# Patient Record
Sex: Female | Born: 1975 | Race: White | Hispanic: No | Marital: Married | Smoking: Never smoker
Health system: Southern US, Community
[De-identification: ages and names within clinical notes are randomized; demographics above are authoritative.]

## PROBLEM LIST (undated history)

## (undated) DIAGNOSIS — R51 Headache: Secondary | ICD-10-CM

## (undated) DIAGNOSIS — G8929 Other chronic pain: Secondary | ICD-10-CM

## (undated) DIAGNOSIS — F329 Major depressive disorder, single episode, unspecified: Secondary | ICD-10-CM

## (undated) DIAGNOSIS — F32A Depression, unspecified: Secondary | ICD-10-CM

## (undated) DIAGNOSIS — F319 Bipolar disorder, unspecified: Secondary | ICD-10-CM

## (undated) DIAGNOSIS — N8003 Adenomyosis of the uterus: Secondary | ICD-10-CM

## (undated) HISTORY — DX: Adenomyosis of the uterus: N80.03

## (undated) HISTORY — PX: CARPAL TUNNEL RELEASE: SHX101

## (undated) HISTORY — DX: Bipolar disorder, unspecified: F31.9

## (undated) HISTORY — DX: Other chronic pain: G89.29

## (undated) HISTORY — DX: Depression, unspecified: F32.A

## (undated) HISTORY — DX: Headache: R51

## (undated) HISTORY — DX: Major depressive disorder, single episode, unspecified: F32.9

---

## 2015-03-24 DIAGNOSIS — F329 Major depressive disorder, single episode, unspecified: Secondary | ICD-10-CM | POA: Insufficient documentation

## 2015-03-24 DIAGNOSIS — F411 Generalized anxiety disorder: Secondary | ICD-10-CM | POA: Insufficient documentation

## 2015-03-24 DIAGNOSIS — F32A Depression, unspecified: Secondary | ICD-10-CM | POA: Insufficient documentation

## 2017-07-19 DIAGNOSIS — E119 Type 2 diabetes mellitus without complications: Secondary | ICD-10-CM | POA: Insufficient documentation

## 2017-10-19 DIAGNOSIS — G43809 Other migraine, not intractable, without status migrainosus: Secondary | ICD-10-CM | POA: Insufficient documentation

## 2017-10-19 DIAGNOSIS — G4752 REM sleep behavior disorder: Secondary | ICD-10-CM | POA: Insufficient documentation

## 2018-08-01 ENCOUNTER — Encounter: Payer: Self-pay | Admitting: Neurology

## 2018-08-22 ENCOUNTER — Ambulatory Visit (INDEPENDENT_AMBULATORY_CARE_PROVIDER_SITE_OTHER): Payer: 59 | Admitting: Neurology

## 2018-08-22 ENCOUNTER — Encounter: Payer: Self-pay | Admitting: Neurology

## 2018-08-22 VITALS — BP 130/82 | HR 94 | Ht 61.0 in | Wt 202.0 lb

## 2018-08-22 DIAGNOSIS — G43709 Chronic migraine without aura, not intractable, without status migrainosus: Secondary | ICD-10-CM

## 2018-08-22 DIAGNOSIS — G43109 Migraine with aura, not intractable, without status migrainosus: Secondary | ICD-10-CM | POA: Diagnosis not present

## 2018-08-22 DIAGNOSIS — G43809 Other migraine, not intractable, without status migrainosus: Secondary | ICD-10-CM

## 2018-08-22 MED ORDER — ERGOTAMINE-CAFFEINE 1-100 MG PO TABS: ORAL_TABLET | ORAL | 3 refills | Status: DC

## 2018-08-22 MED ORDER — ERENUMAB-AOOE 70 MG/ML ~~LOC~~ SOAJ: 70.0000 mg | SUBCUTANEOUS | 11 refills | Status: DC

## 2018-08-22 MED ORDER — ERENUMAB-AOOE 70 MG/ML ~~LOC~~ SOAJ: 70.0000 mg | Freq: Once | SUBCUTANEOUS | 0 refills | Status: AC

## 2018-08-22 NOTE — Addendum Note (Signed)
Addended by: Dorthy Cooler on: 08/22/2018 01:01 PM   Modules accepted: Orders

## 2018-08-22 NOTE — Progress Notes (Signed)
NEUROLOGY CONSULTATION NOTE  Dustine Bertini MRN: [id number] DOB: 05-Sep-1976  Referring provider: Christena Flake, MD  Primary care provider: Christena Flake, MD  Reason for consult:  migraine  HISTORY OF PRESENT ILLNESS: Vanessa Huff is a 42 year old female with depression, generalized anxiety disorder, type 2 diabetes, and vestibular migraine who presents for migraine.  History supplemented by referring providers note.  She is accompanied by her husband who supplements history.  Onset:  Since adolescence Location:  right temple Quality:  Usually non-throbbing ache, sometimes stabbing Intensity:  Usually mild-moderate, then sometimes severe.  She denies new headache, thunderclap headache  Aura:  no Prodrome:  no Postdrome:  no Associated symptoms:  Nausea, vertigo, photophobia, phonophobia.  She denies associated visual disturbance or unilateral numbness or weakness. Duration:  6-8 hours Frequency:  Severe ones occur every 2 to 3 months.  Otherwise a daily headache Frequency of abortive medication: rarely Triggers:  Emotional stress, laying down Relieving factors:  nothing Activity:  Can't function for severe ones She also has vertigo without headache, lasting 1 day (occasinally a week).  Occurs almost everyday.  Meclizine ineffective. Over the past few months, she sees flashing lights or squiggly lines that occur without headache.  They are brief, a few seconds and only has occurred about once a month.  Current NSAIDS:  Ibuprofen (rarely, usually just deals with the headache) Current analgesics:  Fioricet (for severe) Current triptans:  none Current ergotamine:  none Current anti-emetic:  Phenergan 25mg  Current muscle relaxants:  none Current anti-anxiolytic:  Buspirone 10mg  2-3 tablets daily Current sleep aide:  none Current Antihypertensive medications:  none Current Antidepressant medications:  Venlafaxine XR 225mg , amitriptyline 125mg  at bedtime Current  Anticonvulsant medications:  none Current anti-CGRP:  none Current Vitamins/Herbal/Supplements:  D2 Current Antihistamines/Decongestants:  none Other therapy: None Hormone/birth control:  Quasense  Past NSAIDS:  Aleve Past analgesics:  Excedrin Migraine, Tylenol Past abortive triptans:  Sumatriptan tablet (ineffective) Past abortive ergotamine:  none Past muscle relaxants:  none Past anti-emetic:  none Past antihypertensive medications:  none Past antidepressant medicatimions:  none Past anticonvulsant medications:  topiramate 100mg  (ineffective) Past anti-CGRP:  none Past vitamins/Herbal/Supplements:  none Past antihistamines/decongestants:  none Other past headache cocktails:  Decadron/toradol/benadryl/reglan, Zofran and morphine (both ineffective)  Caffeine:  No coffee or soda Diet:  Only water Exercise:  Karate, kickboxing Depression: stable; Anxiety:  yes Other pain:  no Sleep hygiene:  Good with amitriptyline.  She "acts out" in her dreams.  She had a sleep study but she wasn't symptomatic that night.  She woke up multiple times.   Family history of headache:  Son, mom  She reportedly had an MRI of brain at [facility] and was told it was "normal"  05/15/18 CMP: Na 139, K 4.3, Cl 107, CO2 23, glucose 157, BUN 10, Cr 0.58, t bili 0.2, ALP 85, AST 10, ALT 10  PAST MEDICAL HISTORY: Depression Type 2 diabetes mellitus  PAST SURGICAL HISTORY: Right carpal tunnel release  MEDICATIONS: As above  ALLERGIES: Allergies not on file  FAMILY HISTORY: Son:  Migraines Mother:  headaches  SOCIAL HISTORY: Married with children Never smoked  REVIEW OF SYSTEMS: Constitutional: No fevers, chills, or sweats, no generalized fatigue, change in appetite Eyes: No visual changes, double vision, eye pain Ear, nose and throat: No hearing loss, ear pain, nasal congestion, sore throat Cardiovascular: No chest pain, palpitations Respiratory:  No shortness of breath at rest or with  exertion, wheezes GastrointestinaI: No nausea, vomiting, diarrhea, abdominal pain, fecal incontinence  Genitourinary:  No dysuria, urinary retention or frequency Musculoskeletal:  No neck pain, back pain Integumentary: No rash, pruritus, skin lesions Neurological: as above Psychiatric: No depression, insomnia, anxiety Endocrine: No palpitations, fatigue, diaphoresis, mood swings, change in appetite, change in weight, increased thirst Hematologic/Lymphatic:  No purpura, petechiae. Allergic/Immunologic: no itchy/runny eyes, nasal congestion, recent allergic reactions, rashes  PHYSICAL EXAM: Blood pressure 130/82, pulse 94, height 5\' 1"  (1.549 m), weight 202 lb (91.6 kg), SpO2 97 %. General: No acute distress.  Patient appears well-groomed.  Head:  Normocephalic/atraumatic Eyes:  fundi examined but not visualized Neck: supple, no paraspinal tenderness, full range of motion Back: No paraspinal tenderness Heart: regular rate and rhythm Lungs: Clear to auscultation bilaterally. Vascular: No carotid bruits. Neurological Exam: Mental status: alert and oriented to person, place, and time, recent and remote memory intact, fund of knowledge intact, attention and concentration intact, speech fluent and not dysarthric, language intact. Cranial nerves: CN I: not tested CN II: pupils equal, round and reactive to light, visual fields intact CN III, IV, VI:  full range of motion, no nystagmus, no ptosis CN V: facial sensation intact CN VII: upper and lower face symmetric CN VIII: hearing intact CN IX, X: gag intact, uvula midline CN XI: sternocleidomastoid and trapezius muscles intact CN XII: tongue midline Bulk & Tone: normal, no fasciculations. Motor:  5/5 throughout  Sensation: temperature and vibration sensation intact. Deep Tendon Reflexes:  2+ throughout, toes downgoing.  Finger to nose testing:  Without dysmetria.  Heel to shin:  Without dysmetria.  Gait:  Normal station and stride.   Romberg negative.  IMPRESSION: 1.  Chronic migraine without aura, without status migrainosus, not intractable 2.  Vestibular migraine and migrainous vertigo 3.  Ocular migraines  PLAN: 1.  For preventative management, start Aimovig 70mg  monthly, first dose today 2. Reduced amitriptyline to 100mg  at bedtime 3.  For abortive therapy, will try Cafergot.  Advised to not use Fioricet if possible. 4.  Limit use of pain relievers to no more than 2 days out of week to prevent risk of rebound or medication-overuse headache. 5.  Keep headache diary 6.  Exercise, hydration, caffeine cessation, sleep hygiene, monitor for and avoid triggers 7.  Consider:  magnesium citrate 400mg  daily, riboflavin 400mg  daily, and coenzyme Q10 100mg  three times daily 8. Obtain MRI result  9. Follow up in 3 months   Thank you for allowing me to take part in the care of this patient.  Shon Millet, DO  CC: Christena Flake, MD

## 2018-08-22 NOTE — Patient Instructions (Signed)
Migraine Recommendations: 1.  Start Aimovig 70mg  monthly injection.  Start today.  Reduce amitriptyline to 100mg  at bedtime. 2.  Take Cafergot 1 to 2 tablets at earliest onset of migraine.  May repeat every 1 hour up to 4 tablets in 24 hours, no more than 2 days in a week.  Try to not use Fioricet 3.   Limit use of pain relievers to no more than 2 days out of the week.  These medications include acetaminophen, ibuprofen, triptans and narcotics.  This will help reduce risk of rebound headaches. 4.  Be aware of common food triggers such as processed sweets, processed foods with nitrites (such as deli meat, hot dogs, sausages), foods with MSG, alcohol (such as wine), chocolate, certain cheeses, certain fruits (dried fruits, bananas, some citrus fruit), vinegar, diet soda. 4.  Avoid caffeine 5.  Routine exercise 6.  Proper sleep hygiene 7.  Stay adequately hydrated with water 8.  Keep a headache diary. 9.  Maintain proper stress management. 10.  Do not skip meals. 11.  Consider supplements:  Magnesium citrate 400mg  to 600mg  daily, riboflavin 400mg , Coenzyme Q 10 100mg  three times daily 12.  Follow up in 3 months

## 2018-08-23 ENCOUNTER — Other Ambulatory Visit: Payer: Self-pay

## 2018-08-23 MED ORDER — AMITRIPTYLINE HCL 100 MG PO TABS: 100.0000 mg | ORAL_TABLET | Freq: Every day | ORAL | 3 refills | Status: DC

## 2018-09-25 NOTE — Progress Notes (Signed)
PA initiated via [email or web address] for pt's   aimovig 70mg //mL

## 2018-09-28 ENCOUNTER — Telehealth: Payer: Self-pay

## 2018-09-28 NOTE — Telephone Encounter (Signed)
Pt called about Aimovig PA, advised her we are waiting on determination and to continue to use the copay card until then

## 2018-10-09 NOTE — Progress Notes (Signed)
Rcvd approval for Aimovig [id number] for 6 months through 03/26/19

## 2018-10-24 ENCOUNTER — Other Ambulatory Visit: Payer: Self-pay

## 2018-10-24 MED ORDER — AMITRIPTYLINE HCL 100 MG PO TABS: 100.0000 mg | ORAL_TABLET | Freq: Every day | ORAL | 3 refills | Status: DC

## 2018-11-14 ENCOUNTER — Other Ambulatory Visit: Payer: Self-pay

## 2018-11-14 MED ORDER — PROMETHAZINE HCL 25 MG PO TABS: 25.0000 mg | ORAL_TABLET | Freq: Three times a day (TID) | ORAL | 0 refills | Status: DC | PRN

## 2018-11-29 ENCOUNTER — Other Ambulatory Visit: Payer: Self-pay

## 2018-11-29 MED ORDER — PREDNISONE 10 MG (21) PO TBPK: ORAL_TABLET | ORAL | 0 refills | Status: DC

## 2018-12-05 ENCOUNTER — Other Ambulatory Visit: Payer: Self-pay

## 2018-12-05 MED ORDER — DIAZEPAM 2 MG PO TABS: 2.0000 mg | ORAL_TABLET | ORAL | 0 refills | Status: DC | PRN

## 2018-12-19 NOTE — Progress Notes (Signed)
NEUROLOGY FOLLOW UP OFFICE NOTE  Vanessa Huff [id number]  HISTORY OF PRESENT ILLNESS: Vanessa Huff is a 43 year old woman with depression, generalized anxiety disorder, type 2 diabetes and vestibular migraine who follows up for migraine.  UPDATE: Last visit, she was started on Aimovig.  Amitriptyline was reduced from 125 mg to 100 mg at bedtime.  For vertigo, she was started on diazepam and 2 mg.  Vertigo everyday.  Diazepam with Dramamine helps.  `Aimovig only effective for first 2 1/2 weeks. Intensity:  Moderate to severe Duration:  Within 30 minutes it eases up Frequency:  For first 2 weeks, no migraines, last 2 weeks, they are daily which progressively gets worse until next shot. Frequency of abortive medication: Not taking anything this month Current NSAIDS:  Ibuprofen (rarely, usually just deals with the headache) Current analgesics:  none Current triptans:  none Current ergotamine:   Cafergot Current anti-emetic:  Phenergan 25mg  Current muscle relaxants:  none Current anti-anxiolytic:   Diazepam 2 mg every 4 hours as needed for acute vertigo, buspirone 10mg  2-3 tablets daily Current sleep aide:  none Current Antihypertensive medications:  none Current Antidepressant medications:  Venlafaxine XR 225mg , amitriptyline 100mg  at bedtime Current Anticonvulsant medications:  none Current anti-CGRP:   Aimovig 70 mg Current Vitamins/Herbal/Supplements:  D2 Current Antihistamines/Decongestants:  none Other therapy: None Hormone/birth control:  Quasense  Caffeine:  No coffee or soda Diet:  Only water Exercise:  Karate, kickboxing Depression: stable; Anxiety:  yes Other pain:  no Sleep hygiene:  Good with amitriptyline.  She "acts out" in her dreams.  She had a sleep study but she wasn't symptomatic that night.  She woke up multiple times.    HISTORY:  Onset: Since adolescence Location:  right temple Quality:  Usually non-throbbing ache, sometimes stabbing Initial  intensity:  Usually mild-moderate, then sometimes severe.  She denies new headache, thunderclap headache  Aura:  no Prodrome:  no Postdrome:  no Associated symptoms: Nausea, vertigo, photophobia, phonophobia.  She denies associated visual disturbance or unilateral numbness or weakness. Initial duration:  6-8 hours Initial Frequency:  Severe ones occur every 2 to 3 months.  Otherwise a daily headache Initial Frequency of abortive medication: rarely Triggers: Emotional stress, laying down Relieving factors: Nothing Activity:  Can't function for severe ones She also has vertigo without headache, lasting 1 day (occasinally a week).  Occurs almost everyday.  Meclizine ineffective. She also reports episodes consistent with ocular migraines where she sees flashing lights or squiggly lines that occur without headache.  They are brief, a few seconds and only has occurred about once a month.  Past NSAIDS:  Aleve Past analgesics:  Excedrin Migraine, Tylenol, Fioricet Past abortive triptans:  Sumatriptan tablet (ineffective) Past abortive ergotamine:  none Past muscle relaxants:  none Past anti-emetic:  none Past antihypertensive medications:  none Past antidepressant medicatimions:  none Past anticonvulsant medications:  topiramate 100mg  (ineffective) Past anti-CGRP:  none Past vitamins/Herbal/Supplements:  none Past antihistamines/decongestants:  none Other past headache cocktails:  Decadron/toradol/benadryl/reglan, Zofran and morphine (both ineffective) Other past treatment: Meclizine (ineffective)  Family history of headache:  Son, mom  She reportedly had an MRI of brain at [facility] and was told it was "normal"  PAST MEDICAL HISTORY: Past Medical History:  Diagnosis Date  . Chronic headaches   . Depression     MEDICATIONS: Current Outpatient Medications on File Prior to Visit  Medication Sig Dispense Refill  . amitriptyline (ELAVIL) 100 MG tablet Take 125 mg by mouth at  bedtime.    Marland Kitchen  amitriptyline (ELAVIL) 100 MG tablet Take 1 tablet (100 mg total) by mouth at bedtime. 30 tablet 3  . amitriptyline (ELAVIL) 100 MG tablet Take 1 tablet (100 mg total) by mouth at bedtime. 90 tablet 3  . busPIRone (BUSPAR) 10 MG tablet Take by mouth. 3 in the AM, 2-3 additional PRN    . butalbital-acetaminophen-caffeine (FIORICET, ESGIC) 50-325-40 MG tablet Take by mouth 2 (two) times daily as needed for headache.    . diazepam (VALIUM) 2 MG tablet Take 1 tablet (2 mg total) by mouth every 4 (four) hours as needed. 30 tablet 0  . Erenumab-aooe (AIMOVIG) 70 MG/ML SOAJ Inject 70 mg into the skin every 30 (thirty) days. 1 pen 11  . ergotamine-caffeine (CAFERGOT) 1-100 MG tablet 1 to 2 tablets at onset of migraine.  May repeat every 1h up to maximum of 4 tablets in 24h 30 tablet 3  . predniSONE (STERAPRED UNI-PAK 21 TAB) 10 MG (21) TBPK tablet As directed 21 tablet 0  . promethazine (PHENERGAN) 25 MG tablet Take 25 mg by mouth every 6 (six) hours as needed for nausea or vomiting.    . promethazine (PHENERGAN) 25 MG tablet Take 1 tablet (25 mg total) by mouth every 8 (eight) hours as needed for nausea or vomiting. 10 tablet 0  . venlafaxine (EFFEXOR) 75 MG tablet Take 75 mg by mouth daily.    Marland Kitchen. venlafaxine XR (EFFEXOR-XR) 150 MG 24 hr capsule Take 150 mg by mouth daily with breakfast.     No current facility-administered medications on file prior to visit.     ALLERGIES: Allergies  Allergen Reactions  . Lortab [Hydrocodone-Acetaminophen]     nightmares  . Other     DHE - increased blood pressure    FAMILY HISTORY: Family History  Problem Relation Age of Onset  . Breast cancer Mother   . Lung cancer Father    SOCIAL HISTORY: Social History   Socioeconomic History  . Marital status: Married    Spouse name: Baldo AshCarl  . Number of children: Not on file  . Years of education: Not on file  . Highest education level: 12th grade  Occupational History  . Occupation: Proofreaderharmacy  Technician    Employer: [employer]  Social Needs  . Financial resource strain: Not on file  . Food insecurity:    Worry: Not on file    Inability: Not on file  . Transportation needs:    Medical: Not on file    Non-medical: Not on file  Tobacco Use  . Smoking status: Never Smoker  . Smokeless tobacco: Never Used  Substance and Sexual Activity  . Alcohol use: Never    Frequency: Never  . Drug use: Never  . Sexual activity: Not on file  Lifestyle  . Physical activity:    Days per week: Not on file    Minutes per session: Not on file  . Stress: Not on file  Relationships  . Social connections:    Talks on phone: Not on file    Gets together: Not on file    Attends religious service: Not on file    Active member of club or organization: Not on file    Attends meetings of clubs or organizations: Not on file    Relationship status: Not on file  . Intimate partner violence:    Fear of current or ex partner: Not on file    Emotionally abused: Not on file    Physically abused: Not on file  Forced sexual activity: Not on file  Other Topics Concern  . Not on file  Social History Narrative   Patient is right-handed. She lives with her husband in a one story home. She avoids all caffeine. She participates in karate and other MMA sports.    REVIEW OF SYSTEMS: Constitutional: No fevers, chills, or sweats, no generalized fatigue, change in appetite Eyes: No visual changes, double vision, eye pain Ear, nose and throat: No hearing loss, ear pain, nasal congestion, sore throat Cardiovascular: No chest pain, palpitations Respiratory:  No shortness of breath at rest or with exertion, wheezes GastrointestinaI: No nausea, vomiting, diarrhea, abdominal pain, fecal incontinence Genitourinary:  No dysuria, urinary retention or frequency Musculoskeletal:  No neck pain, back pain Integumentary: No rash, pruritus, skin lesions Neurological: as above Psychiatric: No depression, insomnia,  anxiety Endocrine: No palpitations, fatigue, diaphoresis, mood swings, change in appetite, change in weight, increased thirst Hematologic/Lymphatic:  No purpura, petechiae. Allergic/Immunologic: no itchy/runny eyes, nasal congestion, recent allergic reactions, rashes  PHYSICAL EXAM: Blood pressure 126/78, pulse 96, height 5\' 1"  (1.549 m), weight 203 lb (92.1 kg), SpO2 97 %. General: No acute distress.  Patient appears well-groomed.   Head:  Normocephalic/atraumatic Eyes:  Fundi examined but not visualized Neck: supple, no paraspinal tenderness, full range of motion Heart:  Regular rate and rhythm Lungs:  Clear to auscultation bilaterally Back: No paraspinal tenderness Neurological Exam: alert and oriented to person, place, and time. Attention span and concentration intact, recent and remote memory intact, fund of knowledge intact.  Speech fluent and not dysarthric, language intact.  CN II-XII intact. Bulk and tone normal, muscle strength 5/5 throughout.  Sensation to light touch, temperature and vibration intact.  Deep tendon reflexes 2+ throughout, toes downgoing.  Finger to nose and heel to shin testing intact.  Gait normal, Romberg negative.  IMPRESSION: 1.  Chronic migraine without aura, without status migrainosus, not intractable 2.  Vestibular migraine 3.  Ocular migraine   PLAN: 1.  Increase Aimovig to 140mg  monthly 2.  Restart topiramate 25mg  at bedtime for one week, then increase to 25mg  twice daily.  While it was previously ineffective for headache, it may be effective for the vertigo. 3.  For acute migraines, Caffergot 4.  For acute vertigo, diazepam and Dramamine 5.  For nausea, promethazine 6.  Limit use of pain relievers to no more than 2 days out of week to prevent risk of rebound or medication-overuse headache. 7.  Keep headache diary 8.  Follow up in 4 months.  Shon MilletAdam Akemi Overholser, DO  CC:  Christena FlakeWilliam Zimmer, MD

## 2018-12-20 ENCOUNTER — Ambulatory Visit (INDEPENDENT_AMBULATORY_CARE_PROVIDER_SITE_OTHER): Payer: 59 | Admitting: Neurology

## 2018-12-20 ENCOUNTER — Encounter: Payer: Self-pay | Admitting: Neurology

## 2018-12-20 ENCOUNTER — Other Ambulatory Visit: Payer: Self-pay | Admitting: Neurology

## 2018-12-20 VITALS — BP 126/78 | HR 96 | Ht 61.0 in | Wt 203.0 lb

## 2018-12-20 DIAGNOSIS — G43709 Chronic migraine without aura, not intractable, without status migrainosus: Secondary | ICD-10-CM | POA: Diagnosis not present

## 2018-12-20 DIAGNOSIS — G43109 Migraine with aura, not intractable, without status migrainosus: Secondary | ICD-10-CM | POA: Diagnosis not present

## 2018-12-20 MED ORDER — ERENUMAB-AOOE 140 MG/ML ~~LOC~~ SOAJ: 140.0000 mg | SUBCUTANEOUS | 11 refills | Status: DC

## 2018-12-20 MED ORDER — ERENUMAB-AOOE 140 MG/ML ~~LOC~~ SOAJ: 140.0000 mg | Freq: Once | SUBCUTANEOUS | 0 refills | Status: AC

## 2018-12-20 MED ORDER — TOPIRAMATE 25 MG PO TABS: ORAL_TABLET | ORAL | 0 refills | Status: DC

## 2018-12-20 NOTE — Patient Instructions (Signed)
1.  Increase Aimovig to 140mg  monthly 2.  To address the vertigo, restart topiramate 25mg  at bedtime for one week, then increase to 25mg  twice daily 3.  For acute migraines, Caffergot 4.  For acute vertigo, diazepam and Dramamine 5.  For nausea, Phenergan 6.  Limit use of pain relievers to no more than 2 days out of week to prevent risk of rebound or medication-overuse headache. 7.  Keep headache diary 8.  Follow up in 4 months.

## 2019-03-05 ENCOUNTER — Encounter: Payer: Self-pay | Admitting: *Deleted

## 2019-03-05 NOTE — Progress Notes (Addendum)
Vanessa Huff (Key: [id number])  Aimovig 70MG /ML auto-injectors  Form [facility] Electronic Prior Authorization Form (2017 NCPDP)  Created  8 days ago  Sent to Plan  less than a minute ago  Determination  Wait for Questions [facility] 2017 NCPDP typically responds with questions in less than 15 minutes, but may take up to 24 hours.  Your request has been approved  Request Reference Number: [id number]. AIMOVIG INJ 140MG /ML is approved through 03/04/2020. For further questions, call [phone]. Your request has been approved  Request Reference Number: [id number]. AIMOVIG INJ 140MG /ML is approved through 03/04/2020. For further questions, call [phone].

## 2019-03-07 NOTE — Progress Notes (Signed)
Rcvd fax approval for Aimovig 140 mg  from [facility], approved through 03/04/20  Called [facility] in [place], spoke with EmEm, advised her of approval.

## 2019-04-04 ENCOUNTER — Other Ambulatory Visit: Payer: Self-pay

## 2019-04-04 ENCOUNTER — Telehealth: Payer: Self-pay

## 2019-04-04 MED ORDER — AMITRIPTYLINE HCL 100 MG PO TABS: 100.0000 mg | ORAL_TABLET | Freq: Every day | ORAL | 0 refills | Status: DC

## 2019-04-04 NOTE — Telephone Encounter (Signed)
Received refill request from [facility].  Pt was last seen 12/2018. She has an upcoming appt 04/2019. One refill of Amitriptyline sent to [facility].

## 2019-04-24 ENCOUNTER — Encounter: Payer: Self-pay | Admitting: Neurology

## 2019-04-24 NOTE — Progress Notes (Signed)
error 

## 2019-04-25 ENCOUNTER — Other Ambulatory Visit: Payer: Self-pay

## 2019-04-25 ENCOUNTER — Telehealth: Payer: 59 | Admitting: Neurology

## 2019-05-13 NOTE — Progress Notes (Signed)
Virtual Visit via Video Note The purpose of this virtual visit is to provide medical care while limiting exposure to the novel coronavirus.    Consent was obtained for video visit:  Yes.   Answered questions that patient had about telehealth interaction:  Yes.   I discussed the limitations, risks, security and privacy concerns of performing an evaluation and management service by telemedicine. I also discussed with the patient that there may be a patient responsible charge related to this service. The patient expressed understanding and agreed to proceed.  Pt location: Home Physician Location: office Name of referring provider:  Christena FlakeZimmer, William, MD I connected with Vanessa CottonElizabeth Huff at patients initiation/request on 05/14/2019 at  8:30 AM EDT by video enabled telemedicine application and verified that I am speaking with the correct person using two identifiers. Pt MRN:  [id number] Pt DOB:  01/26/1976 Video Participants:  Vanessa CottonElizabeth Huff   History of Present Illness:  Vanessa Cottonlizabeth Huff is a 43 year old woman with depression, generalized anxiety disorder, type 2 diabetes and vestibular migraine who follows up for migraines.   UPDATE: When topiramate was first increased, vertigo and migraines improved.  However they started increasing over past month.  Her head now hurts every night when she lays down in bed.   Intensity:  Moderate to severe Duration:  30 minutes with Cafergot Frequency:  Every 2 to 3 weeks Vertigo:  Now daily, lasting all day Rescue protocol for common migraine:  Cafergot; phenrgan for nausea Rescue protocol for vertigo:  Diazepam and Dramamine Frequency of abortive medication: Cafergot every 2 to 3 weeks Current NSAIDS:  Ibuprofen (rarely, usually just deals with the headache) Current analgesics:  none Current triptans:  none Current ergotamine:   Cafergot Current anti-emetic:  Phenergan 25mg  Current muscle relaxants:  none Current anti-anxiolytic:   Diazepam 2  mg every 4 hours as needed for acute vertigo, buspirone 10mg  2-3 tablets daily Current sleep aide:  none Current Antihypertensive medications:  none Current Antidepressant medications:  Venlafaxine XR 225mg , amitriptyline 100mg  at bedtime Current Anticonvulsant medications:  topirmate 25mg  twice daily Current anti-CGRP:   Aimovig 140 mg Current Vitamins/Herbal/Supplements:  D2 Current Antihistamines/Decongestants:  none Other therapy: None Hormone/birth control:  Quasense   Caffeine:  No coffee or soda Diet:  Only water Exercise:  Karate, kickboxing Depression: stable; Anxiety:  yes Other pain:  no Sleep hygiene:  Good with amitriptyline.  She "acts out" in her dreams.  She had a sleep study but she wasn't symptomatic that night.  She woke up multiple times.     HISTORY:  Onset: Since adolescence Location:  right temple Quality:  Usually non-throbbing ache, sometimes stabbing Initial intensity:  Usually mild-moderate, then sometimes severe.  She denies new headache, thunderclap headache  Aura:  no Prodrome:  no Postdrome:  no Associated symptoms: Nausea, vertigo, photophobia, phonophobia.  She denies associated visual disturbance or unilateral numbness or weakness. Initial duration:  6-8 hours Initial Frequency:  Severe ones occur every 2 to 3 months.  Otherwise a daily headache Initial Frequency of abortive medication: rarely Triggers: Emotional stress, laying down Relieving factors: Nothing Activity:  Can't function for severe ones She also has vertigo without headache, lasting 1 day (occasinally a week).  Occurs almost everyday.  Meclizine ineffective. She also reports episodes consistent with ocular migraines where she sees flashing lights or squiggly lines that occur without headache.  They are brief, a few seconds and only has occurred about once a month.   Past NSAIDS:  Aleve Past analgesics:  Excedrin Migraine, Tylenol, Fioricet Past abortive triptans:  Sumatriptan  tablet (ineffective) Past abortive ergotamine:  none Past muscle relaxants:  none Past anti-emetic:  none Past antihypertensive medications:  none Past antidepressant medicatimions:  none Past anticonvulsant medications:  topiramate 100mg  (ineffective) Past anti-CGRP:  none Past vitamins/Herbal/Supplements:  none Past antihistamines/decongestants:  none Other past headache cocktails:  Decadron/toradol/benadryl/reglan, Zofran and morphine (both ineffective) Other past treatment: Meclizine (ineffective)   Family history of headache:  Son, mom   She reportedly had an MRI of brain in 2018 at [facility] and was told it was "normal"  Past Medical History: Past Medical History:  Diagnosis Date   Chronic headaches    Depression     Medications: Outpatient Encounter Medications as of 05/14/2019  Medication Sig   amitriptyline (ELAVIL) 100 MG tablet Take 1 tablet (100 mg total) by mouth at bedtime.   busPIRone (BUSPAR) 10 MG tablet Take by mouth. 3 in the AM, 2-3 additional PRN   diazepam (VALIUM) 2 MG tablet Take 1 tablet (2 mg total) by mouth every 4 (four) hours as needed. (Patient not taking: Reported on 04/24/2019)   Erenumab-aooe (AIMOVIG) 140 MG/ML SOAJ Inject 140 mg into the skin every 30 (thirty) days.   ergotamine-caffeine (CAFERGOT) 1-100 MG tablet 1 to 2 tablets at onset of migraine.  May repeat every 1h up to maximum of 4 tablets in 24h   levonorgestrel-ethinyl estradiol (INTROVALE) 0.15-0.03 MG tablet Take 1 tablet by mouth daily.   metFORMIN (GLUCOPHAGE) 500 MG tablet Take 500 mg by mouth daily.   promethazine (PHENERGAN) 25 MG tablet Take 25 mg by mouth every 6 (six) hours as needed for nausea or vomiting.   topiramate (TOPAMAX) 25 MG tablet TAKE 1 TABLET BY MOUTH AT BEDTIME FOR 1 WEEK THEN TAKE 1 TABLET BY MOUTH TWICE DAILY   venlafaxine (EFFEXOR) 75 MG tablet Take 75 mg by mouth daily.   venlafaxine XR (EFFEXOR-XR) 150 MG 24 hr capsule Take 150 mg by mouth  daily with breakfast.   Vitamin D, Ergocalciferol, (DRISDOL) 1.25 MG (50000 UT) CAPS capsule Take 50,000 Units by mouth every 7 (seven) days.   No facility-administered encounter medications on file as of 05/14/2019.     Allergies: Allergies  Allergen Reactions   Lortab [Hydrocodone-Acetaminophen]     nightmares   Other     DHE - increased blood pressure    Family History: Family History  Problem Relation Age of Onset   Breast cancer Mother    Lung cancer Father     Social History: Social History   Socioeconomic History   Marital status: Married    Spouse name: Glendell Docker   Number of children: Not on file   Years of education: Not on file   Highest education level: 12th grade  Occupational History   Occupation: Psychologist, forensic: [employer] resource strain: Not on file   Food insecurity    Worry: Not on file    Inability: Not on file   Transportation needs    Medical: Not on file    Non-medical: Not on file  Tobacco Use   Smoking status: Never Smoker   Smokeless tobacco: Never Used  Substance and Sexual Activity   Alcohol use: Never    Frequency: Never   Drug use: Never   Sexual activity: Not on file  Lifestyle   Physical activity    Days per week: Not on file    Minutes per session: Not on  file   Stress: Not on file  Relationships   Social connections    Talks on phone: Not on file    Gets together: Not on file    Attends religious service: Not on file    Active member of club or organization: Not on file    Attends meetings of clubs or organizations: Not on file    Relationship status: Not on file   Intimate partner violence    Fear of current or ex partner: Not on file    Emotionally abused: Not on file    Physically abused: Not on file    Forced sexual activity: Not on file  Other Topics Concern   Not on file  Social History Narrative   Patient is right-handed. She lives with her husband  in a one story home. She avoids all caffeine. She participates in karate and other MMA sports.    Observations/Objective:   Temperature (!) 97 F (36.1 C), height 5\' 1"  (1.549 m), weight 198 lb (89.8 kg). No acute distress.  Alert and oriented.  Speech fluent and not dysarthric.  Language intact.  Eyes orthophoric on primary gaze.  Face symmetric.  Assessment and Plan:   1.  Migraine without aura, without status migrainosus, not intractable 2.  Vestibular migraine 3.  Ocular migraine  Increased headaches (not migraines) and vertigo, now daily.  I would like to check MRI of brain with and without contrast to evaluate for secondary etiology.  1. MRI of brain with and without contrast  2. For preventative management, increase topiramate to 50mg  twice daily.  Continue Aimovig 140mg  3.  For abortive therapy, Cafergot 4.  Limit use of pain relievers to no more than 2 days out of week to prevent risk of rebound or medication-overuse headache. 5. For acute vertigo:  Diazepam and Dramamine 6. For nausea:  promethazine 7.  Keep headache diary 8.  Exercise, hydration, caffeine cessation, sleep hygiene, monitor for and avoid triggers 9.  Consider:  magnesium citrate 400mg  daily, riboflavin 400mg  daily, and coenzyme Q10 100mg  three times daily 10. Always keep in mind that currently taking a hormone or birth control may be a possible trigger or aggravating factor for migraine. 11. Follow up in 4 months.  Follow Up Instructions:    -I discussed the assessment and treatment plan with the patient. The patient was provided an opportunity to ask questions and all were answered. The patient agreed with the plan and demonstrated an understanding of the instructions.   The patient was advised to call back or seek an in-person evaluation if the symptoms worsen or if the condition fails to improve as anticipated.   Cira ServantAdam Robert Calista Crain, DO

## 2019-05-14 ENCOUNTER — Telehealth (INDEPENDENT_AMBULATORY_CARE_PROVIDER_SITE_OTHER): Payer: 59 | Admitting: Neurology

## 2019-05-14 ENCOUNTER — Encounter: Payer: Self-pay | Admitting: Neurology

## 2019-05-14 ENCOUNTER — Other Ambulatory Visit: Payer: Self-pay

## 2019-05-14 VITALS — Temp 97.0°F | Ht 61.0 in | Wt 198.0 lb

## 2019-05-14 DIAGNOSIS — G43709 Chronic migraine without aura, not intractable, without status migrainosus: Secondary | ICD-10-CM

## 2019-05-14 DIAGNOSIS — G43109 Migraine with aura, not intractable, without status migrainosus: Secondary | ICD-10-CM

## 2019-05-14 DIAGNOSIS — R519 Headache, unspecified: Secondary | ICD-10-CM

## 2019-05-14 MED ORDER — TOPIRAMATE 50 MG PO TABS: 50.0000 mg | ORAL_TABLET | Freq: Two times a day (BID) | ORAL | 3 refills | Status: DC

## 2019-05-14 NOTE — Addendum Note (Signed)
Addended by: Clois Comber on: 05/14/2019 08:57 AM   Modules accepted: Orders

## 2019-05-14 NOTE — Patient Instructions (Signed)
1. MRI of brain with and without contrast  2. For preventative management, increase topiramate:  Take 25mg  in morning and 50mg  at night for one week, then 50mg  twice daily.  Continue Aimovig 140mg  3.  For abortive therapy, Cafergot 4.  Limit use of pain relievers to no more than 2 days out of week to prevent risk of rebound or medication-overuse headache. 5. For acute vertigo:  Diazepam and Dramamine 6. For nausea:  promethazine 7.  Keep headache diary 8.  Exercise, hydration, caffeine cessation, sleep hygiene, monitor for and avoid triggers 9.  Consider:  magnesium citrate 400mg  daily, riboflavin 400mg  daily, and coenzyme Q10 100mg  three times daily 10. Always keep in mind that currently taking a hormone or birth control may be a possible trigger or aggravating factor for migraine. 11. Follow up in 4 months.

## 2019-06-07 ENCOUNTER — Other Ambulatory Visit: Payer: Self-pay

## 2019-06-07 ENCOUNTER — Ambulatory Visit
Admission: RE | Admit: 2019-06-07 | Discharge: 2019-06-07 | Disposition: A | Discharge status: Home / Self Care | Payer: 59 | Source: Ambulatory Visit | Attending: Neurology | Admitting: Neurology

## 2019-06-07 DIAGNOSIS — R519 Headache, unspecified: Secondary | ICD-10-CM

## 2019-06-07 DIAGNOSIS — G43109 Migraine with aura, not intractable, without status migrainosus: Secondary | ICD-10-CM

## 2019-06-07 MED ORDER — GADOBENATE DIMEGLUMINE 529 MG/ML IV SOLN
19.0000 mL | Freq: Once | INTRAVENOUS | Status: AC | PRN
  Administered 2019-06-07: 19 mL via INTRAVENOUS

## 2019-06-11 ENCOUNTER — Other Ambulatory Visit: Payer: 59

## 2019-08-04 IMAGING — MR MRI HEAD WITHOUT AND WITH CONTRAST
12 series · 48 of 48 positions shown · IV contrast (multihance)
Comparison: None.

CLINICAL DATA: Worsening migraines beginning in 1596 concentrated
in the right temple area. Vertigo.

Creatinine was obtained on site at [HOSPITAL] at [HOSPITAL].
Results: Creatinine 0.7 mg/dL.
EXAM:
MRI HEAD WITHOUT AND WITH CONTRAST
TECHNIQUE: Multiplanar, multiecho pulse sequences of the brain and surrounding
structures were obtained without and with intravenous contrast.
CONTRAST:  19mL MULTIHANCE GADOBENATE DIMEGLUMINE 529 MG/ML IV SOLN

[Series 2: T1 · sagittal · 5.0mm · 0.45mm/px · 1 of 21 slices shown]
[im 1/21]
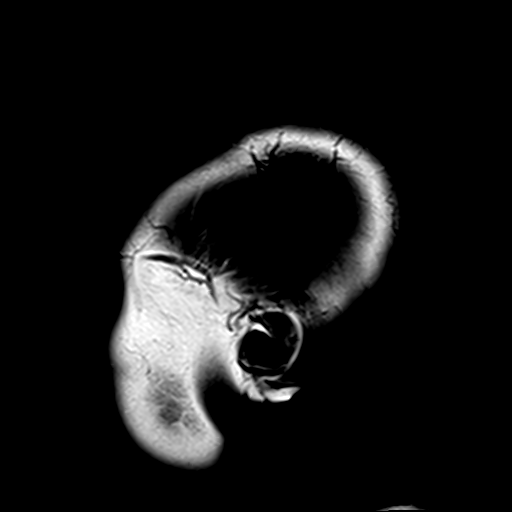

[Series 3: DWI · axial · 3.0mm · 1.80mm/px · z∈[-77,+70]mm · 7 of 99 slices shown (1 of 4)]
[im 1/99]
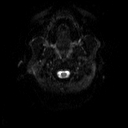
[im 17/99]
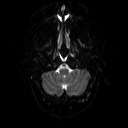
[im 33/99]
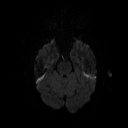
[im 50/99]
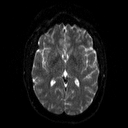
[im 66/99]
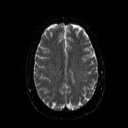
[im 82/99]
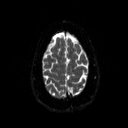
[im 99/99]
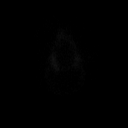

[Series 4: DWI · axial · 3.0mm · 1.80mm/px · z∈[-77,+70]mm · 3 of 50 slices shown (2 of 4)]
[im 1/50]
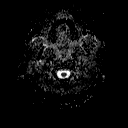
[im 25/50]
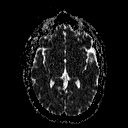
[im 50/50]
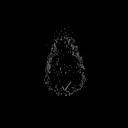

[Series 5: DWI · coronal · 5.0mm · 1.80mm/px · 5 of 67 slices shown (3 of 4)]
[im 1/67]
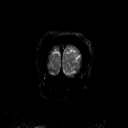
[im 17/67]
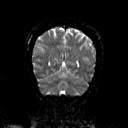
[im 34/67]
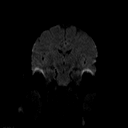
[im 50/67]
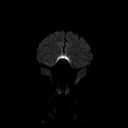
[im 67/67]
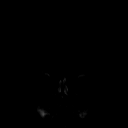

[Series 6: DWI · coronal · 5.0mm · 1.80mm/px · 2 of 34 slices shown (4 of 4)]
[im 1/34]
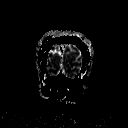
[im 34/34]
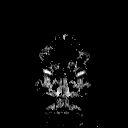

[Series 7: T2 · axial · 5.0mm · 0.60mm/px · z∈[-74,+67]mm · 2 of 22 slices shown (1 of 2)]
[im 1/22]
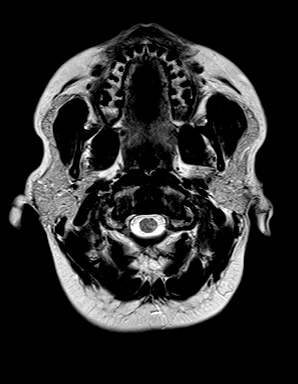
[im 22/22]
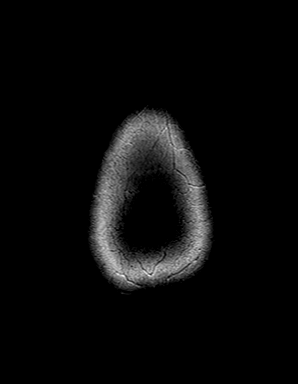

[Series 8: FLAIR · axial · 3.0mm · 0.45mm/px · z∈[-71,+64]mm · 2 of 30 slices shown]
[im 1/30]
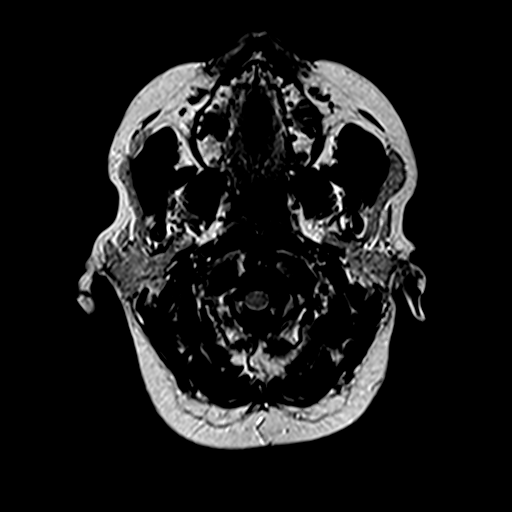
[im 30/30]
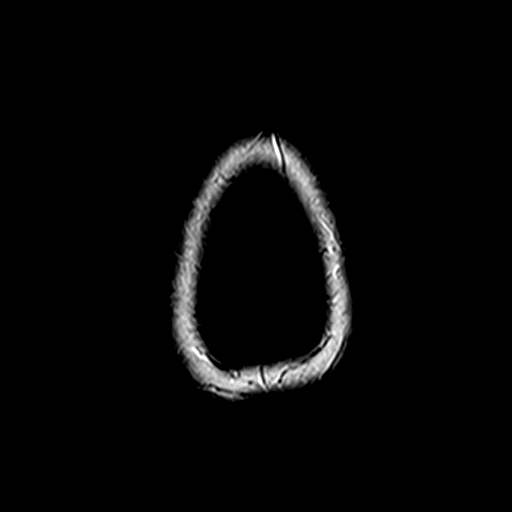

[Series 10: swi_images · axial · 4.0mm · 0.90mm/px · z∈[-73,+67]mm · 2 of 36 slices shown]
[im 1/36]
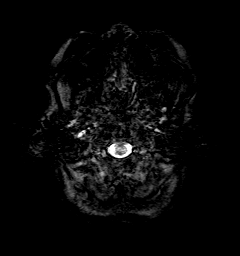
[im 36/36]
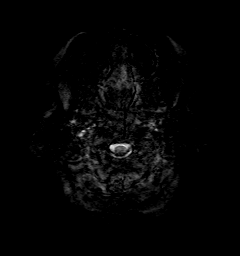

[Series 11: t1_mpr_tra · axial · 1.0mm · 0.75mm/px · z∈[-75,+68]mm · 10 of 144 slices shown (1 of 2)]
[im 1/144]
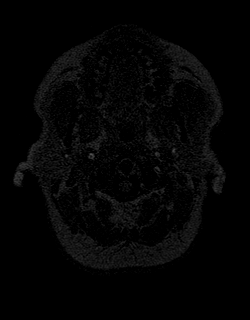
[im 16/144]
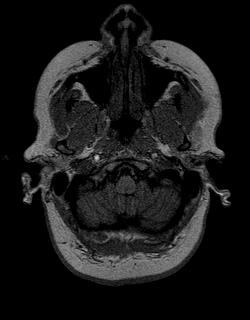
[im 32/144]
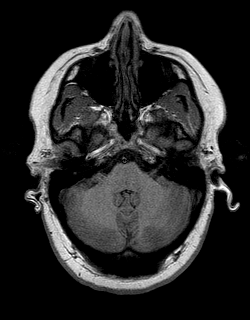
[im 48/144]
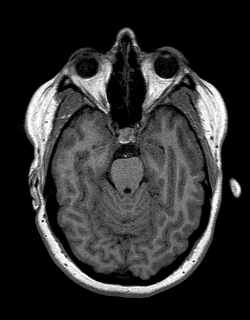
[im 64/144]
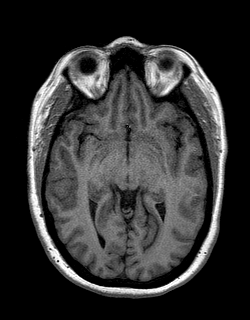
[im 80/144]
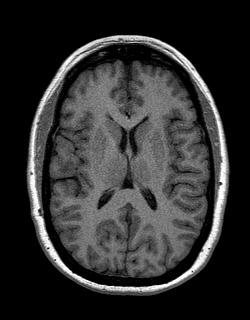
[im 96/144]
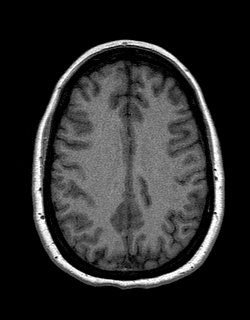
[im 112/144]
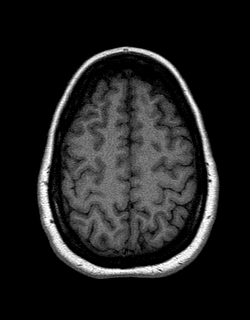
[im 128/144]
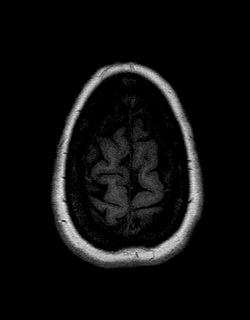
[im 144/144]
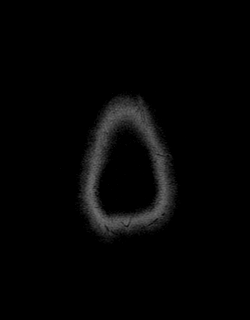

[Series 12: T2 · coronal · 5.0mm · 0.45mm/px · 2 of 25 slices shown (2 of 2)]
[im 1/25]
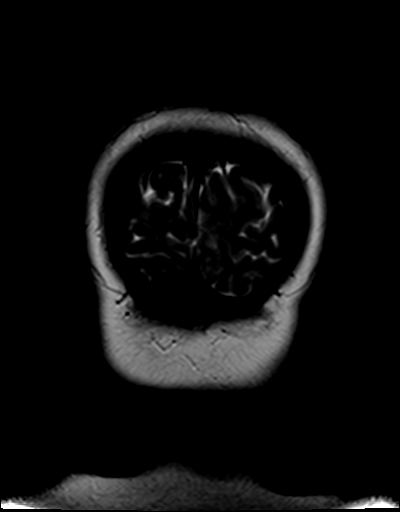
[im 25/25]
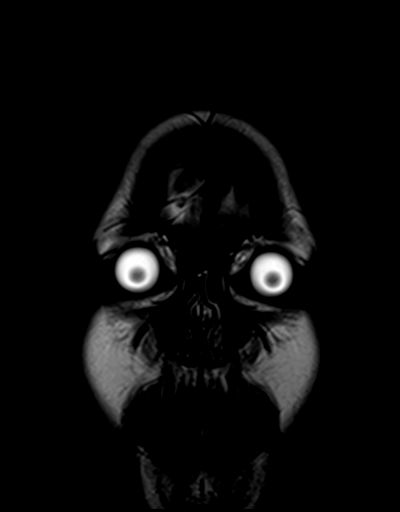

[Series 13: t1_mpr_tra · axial · 1.0mm · 0.75mm/px · z∈[-75,+68]mm · 10 of 144 slices shown (2 of 2)]
[im 1/144]
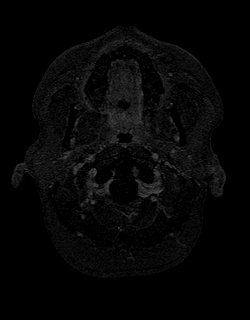
[im 16/144]
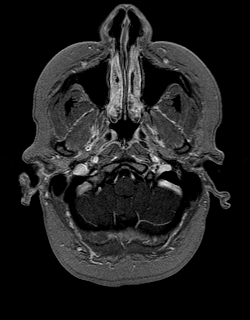
[im 32/144]
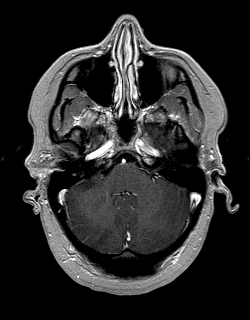
[im 48/144]
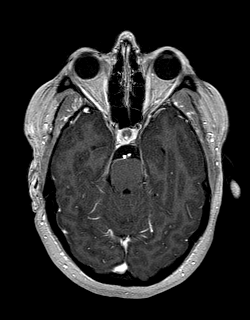
[im 64/144]
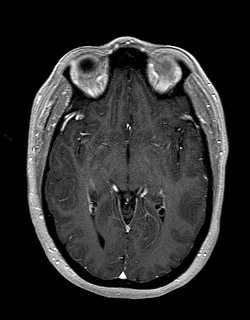
[im 80/144]
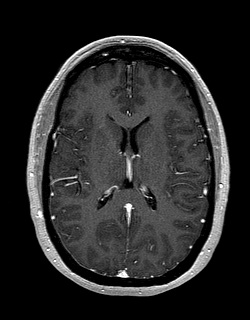
[im 96/144]
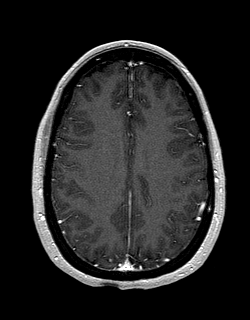
[im 112/144]
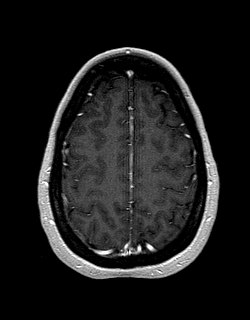
[im 128/144]
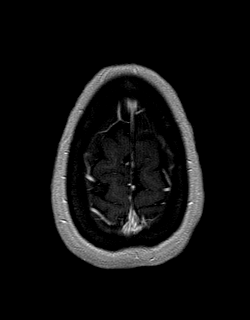
[im 144/144]
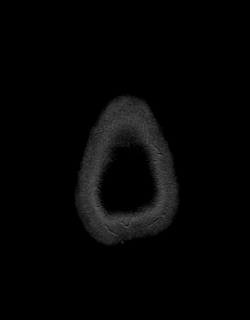

[Series 14: post cor · coronal · 5.0mm · 0.45mm/px · 2 of 25 slices shown]
[im 1/25]
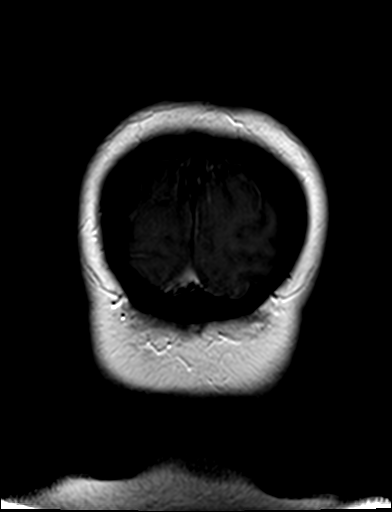
[im 25/25]
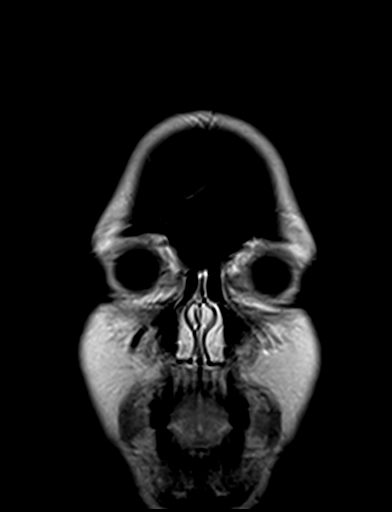

[48 of 48 positions shown; findings below may reference images not displayed]

FINDINGS: Brain: There is no evidence of acute infarct, intracranial
hemorrhage, mass, midline shift, or extra-axial fluid collection.
The ventricles and sulci are normal. A few punctate foci of T2 FLAIR
hyperintensity in the cerebral white matter are nonspecific and not
considered abnormal for age. No abnormal enhancement is identified.

Vascular: Major intracranial vascular flow voids are preserved.

Skull and upper cervical spine: Unremarkable bone marrow signal.

Sinuses/Orbits: Unremarkable orbits. Paranasal sinuses and mastoid
air cells are clear.

Other: None.
IMPRESSION: Negative brain MRI.

## 2019-09-09 ENCOUNTER — Other Ambulatory Visit: Payer: Self-pay | Admitting: Neurology

## 2019-09-09 NOTE — Telephone Encounter (Signed)
Requested Prescriptions   Pending Prescriptions Disp Refills  . topiramate (TOPAMAX) 50 MG tablet [Pharmacy Med Name: TOPIRAMATE 50MG  TABLETS] 60 tablet 3    Sig: TAKE 1 TABLET(50 MG) BY MOUTH TWICE DAILY   Rx last filled: 05/14/19 #60 3 refills  Pt last seen: 05/14/19   Follow up appt scheduled:none

## 2019-10-01 ENCOUNTER — Other Ambulatory Visit: Payer: Self-pay | Admitting: Neurology

## 2019-10-01 NOTE — Telephone Encounter (Signed)
Requested Prescriptions   Pending Prescriptions Disp Refills  . amitriptyline (ELAVIL) 100 MG tablet [Pharmacy Med Name: AMITRIPTYLINE 100MG  (HUNDRED MG)TAB] 90 tablet     Sig: TAKE 1 TABLET(100 MG) BY MOUTH AT BEDTIME   Rx last filled: 04/04/19 #30 0 refills  Pt last seen: 05/14/19   Follow up appt scheduled: none

## 2019-11-19 ENCOUNTER — Encounter: Payer: Self-pay | Admitting: Neurology

## 2019-11-19 NOTE — Progress Notes (Signed)
Key [id number]   Pa submitted.

## 2019-12-03 ENCOUNTER — Encounter: Payer: Self-pay | Admitting: *Deleted

## 2019-12-03 NOTE — Progress Notes (Signed)
12/03/2019 Shon Millet [address] [place] [address] [place], [place] [address] Plan member ID: [id number] Case number: [id number] Prescriber name: Shon Millet Prescriber fax: [phone] NOTICE OF APPROVAL Dear Julieanne Cotton, [facility], on behalf of [facility], is responsible for reviewing pharmacy services provided to [facility] members. [facility] received a request on 12/03/2019 from your prescriber for coverage of Aimovig Inj 140mg /Ml. Your request for Aimovig Inj 140mg /Ml has been approved. How long does this approval last? AIMOVIG INJ 140MG /ML, use as directed, is approved through 06/01/2020. Please note: Doses/ quantities above plan limits and/or maximum Food and Drug Administration (FDA) approved dosing may be subject to further review. Reviewed by: System What happens when the authorization expires? It is recommended that your prescriber contact [facility] for continued authorization before your authorization expires so that you do not experience any disruption in therapy. Thank you for your patience and understanding during the review process. If you have an active prescription for this medication, you may now fill. If you have any additional questions, please call [facility] toll-free at [phone]. Sincerely, [facility]  Faxed letter sent to scan

## 2019-12-03 NOTE — Progress Notes (Signed)
Vanessa Huff (Key: [id number])  The questions associated with this PA have expired and can no longer be submitted to [facility]. To create a new request, select the 'Renew' button on the left.   Due to not completing this PA consequentially it expired so a new PA was started

## 2019-12-03 NOTE — Progress Notes (Addendum)
Vanessa Huff (Key: [id number]) Aimovig 140MG /ML [place] SOAJ   Form [facility] Electronic Prior Authorization Form (2017 NCP[employer]) Created 8 minutes ago Sent to Plan 6 minutes ago Plan Response 5 minutes ago Submit Clinical Questions less than a minute ago Determination Wait for Determination Please wait for [facility] 2017 NCP[employer] to return a determination. The above new PA was initiated due to the following.  Vanessa Huff (Key: [id number])  The questions associated with this PA have expired and can no longer be submitted to [facility]. To create a new request, select the 'Renew' button on the left.

## 2019-12-08 ENCOUNTER — Other Ambulatory Visit: Payer: Self-pay | Admitting: Neurology

## 2019-12-31 NOTE — Progress Notes (Signed)
Virtual Visit via Video Note The purpose of this virtual visit is to provide medical care while limiting exposure to the novel coronavirus.    Consent was obtained for video visit:  Yes.   Answered questions that patient had about telehealth interaction:  Yes.   I discussed the limitations, risks, security and privacy concerns of performing an evaluation and management service by telemedicine. I also discussed with the patient that there may be a patient responsible charge related to this service. The patient expressed understanding and agreed to proceed.  Pt location: Home Physician Location: office Name of referring provider:  Christena Flake, MD I connected with Vanessa Huff at patients initiation/request on 01/02/2020 at  8:30 AM EST by video enabled telemedicine application and verified that I am speaking with the correct person using two identifiers. Pt MRN:  [id number] Pt DOB:  11/16/1975 Video Participants:  Vanessa Huff   History of Present Illness:  Vanessa Huff is a 44 year old woman with depression, generalized anxiety disorder, type 2 diabetes and vestibular migraine who follows up for migraines.  UPDATE: Due to increased migraines, topiramate was increased to 50mg  twice daily in July.  Due to increased migraines and vertigo, she had an MRI of brain with and without contrast on 06/07/2019 which was personally reviewed and was unremarkable.  Migraines and vertigo were much improved.  Then vertigo started getting worse in December.  Migraines started to occur the first week of each month, 3 to 4 times lasting 4 to 6 hours.  One she needs to take Cafergot because more severe.  Unknown migraine trigger.  It is not related to menstrual cycle or dose of Aimovig).  However, vertigo is worse with storms.    For a few years, she also notes that if she is sitting for 45 to 60 minutes, then she becomes extremely drowsy.  It has been more prominent over the past several  months.  On prolonged car rides it will occur and she is unable to drive.    Intensity:  Moderate to severe Duration:  30 minutes with Cafergot Frequency:  Every 2 to 3 weeks Vertigo:  Daily.  Worse with movement.  Unable to exercise.  Rescue protocol for common migraine:  Cafergot; phenrgan for nausea Rescue protocol for vertigo:  Diazepam and Dramamine Frequency of abortive medication:Cafergot every 2 to 3 weeks Current NSAIDS:Ibuprofen (rarely, usually just deals with the headache) Current analgesics:none Current triptans:none Current ergotamine:Cafergot Current anti-emetic:Phenergan 25mg  Current muscle relaxants:none Current anti-anxiolytic:Diazepam 2 mg every 4 hours as needed for acute vertigo, buspirone 10mg  2-3 tablets daily Current sleep aide:none Current Antihypertensive medications:none Current Antidepressant medications:Venlafaxine XR225mg , amitriptyline 100mg  at bedtime Current Anticonvulsant medications:topirmate 50mg  twice daily Current anti-CGRP:Aimovig 140 mg Current Vitamins/Herbal/Supplements:D2 Current Antihistamines/Decongestants:none Other therapy:None Hormone/birth control: Quasense  Caffeine:No coffee or soda Diet:Only water Exercise:Karate, kickboxing Depression:stable; Anxiety:yes Other pain:no Sleep hygiene:Good with amitriptyline. She "acts out" in her dreams. She had a sleep study but she wasn't symptomatic that night. She woke up multiple times.  HISTORY: Onset: Since adolescence Location:right temple Quality:Usually non-throbbing ache, sometimes stabbing Initial intensity:Usually mild-moderate, then sometimes severe.Shedenies new headache, thunderclap headache  Aura:no Prodrome:no Postdrome:no Associated symptoms: Nausea, vertigo, photophobia, phonophobia.Shedenies associated visual disturbance orunilateral numbness or weakness. Initial duration:6-8  hours InitialFrequency:Severe ones occur every 2 to 3 months. Otherwise a daily headache InitialFrequency of abortive medication:rarely Triggers: Emotional stress, laying down Relieving factors: Nothing Activity:Can't function for severe ones She also has vertigo without headache, lasting 1 day (occasinally a week). Occurs almost everyday.  Meclizine ineffective. She also reports episodes consistent with ocular migraines whereshe sees flashing lights or squiggly lines that occur without headache. They are brief, a few seconds and only has occurred about once a month.  Past NSAIDS:Aleve Past analgesics:Excedrin Migraine, Tylenol, Fioricet Past abortive triptans:Sumatriptan tablet (ineffective) Past abortive ergotamine:none Past muscle relaxants:none Past anti-emetic:none Past antihypertensive medications:none Past antidepressant medicatimions:none Past anticonvulsant medications:topiramate 100mg  (ineffective) Past anti-CGRP:none Past vitamins/Herbal/Supplements:none Past antihistamines/decongestants:none Other pastheadache cocktails: Decadron/toradol/benadryl/reglan, Zofran and morphine (both ineffective) Other past treatment: Meclizine (ineffective)  Family history of headache:Son, mom  06/07/2019 MRI BRAIN W WO:  There is no evidence of acute infarct, intracranial hemorrhage, mass, midline shift, or extra-axial fluid collection.  The ventricles and sulci are normal.  A few punctate foci of T2 FLAIR hyperintensity in the cerebral white matter are nonspecific and not considered abnormal for age.  No abnormal enhancement is identified.  Vascular:  Major intracranial vascular flow voids are preserved.    Past Medical History: Past Medical History:  Diagnosis Date  . Chronic headaches   . Depression     Medications: Outpatient Encounter Medications as of 01/02/2020  Medication Sig  . amitriptyline (ELAVIL) 100 MG tablet TAKE 1 TABLET(100 MG)  BY MOUTH AT BEDTIME  . busPIRone (BUSPAR) 10 MG tablet Take by mouth. 3 in the AM, 2-3 additional PRN  . diazepam (VALIUM) 2 MG tablet Take 1 tablet (2 mg total) by mouth every 4 (four) hours as needed. (Patient not taking: Reported on 04/24/2019)  . Erenumab-aooe (AIMOVIG) 140 MG/ML SOAJ Inject 140 mg into the skin every 30 (thirty) days.  . ergotamine-caffeine (CAFERGOT) 1-100 MG tablet 1 to 2 tablets at onset of migraine.  May repeat every 1h up to maximum of 4 tablets in 24h  . levonorgestrel-ethinyl estradiol (INTROVALE) 0.15-0.03 MG tablet Take 1 tablet by mouth daily.  . metFORMIN (GLUCOPHAGE) 500 MG tablet Take 500 mg by mouth daily.  . promethazine (PHENERGAN) 25 MG tablet Take 25 mg by mouth every 6 (six) hours as needed for nausea or vomiting.  . topiramate (TOPAMAX) 50 MG tablet TAKE 1 TABLET(50 MG) BY MOUTH TWICE DAILY  . venlafaxine (EFFEXOR) 75 MG tablet Take 75 mg by mouth daily.  05-15-1997 venlafaxine XR (EFFEXOR-XR) 150 MG 24 hr capsule Take 150 mg by mouth daily with breakfast.  . Vitamin D, Ergocalciferol, (DRISDOL) 1.25 MG (50000 UT) CAPS capsule Take 50,000 Units by mouth every 7 (seven) days.   No facility-administered encounter medications on file as of 01/02/2020.    Allergies: Allergies  Allergen Reactions  . Lortab [Hydrocodone-Acetaminophen]     nightmares  . Other     DHE - increased blood pressure    Family History: Family History  Problem Relation Age of Onset  . Breast cancer Mother   . Lung cancer Father     Social History: Social History   Socioeconomic History  . Marital status: Married    Spouse name: 01/04/2020  . Number of children: Not on file  . Years of education: Not on file  . Highest education level: 12th grade  Occupational History  . 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 Smoking status: Never Smoker  . Smokeless tobacco: Never Used  Substance and Sexual Activity  . Alcohol use: Never  . Drug use: Never  .  Sexual activity: Not on file  Other Topics Concern  . Not on file  Social History Narrative   Patient is right-handed. She lives with her husband  in a one story home. She avoids all caffeine. She participates in karate and other MMA sports.   Social Determinants of Health   Financial Resource Strain:   . Difficulty of Paying Living Expenses: Not on file  Food Insecurity:   . Worried About Charity fundraiser in the Last Year: Not on file  . Ran Out of Food in the Last Year: Not on file  Transportation Needs:   . Lack of Transportation (Medical): Not on file  . Lack of Transportation (Non-Medical): Not on file  Physical Activity:   . Days of Exercise per Week: Not on file  . Minutes of Exercise per Session: Not on file  Stress:   . Feeling of Stress : Not on file  Social Connections:   . Frequency of Communication with Friends and Family: Not on file  . Frequency of Social Gatherings with Friends and Family: Not on file  . Attends Religious Services: Not on file  . Active Member of Clubs or Organizations: Not on file  . Attends Archivist Meetings: Not on file  . Marital Status: Not on file  Intimate Partner Violence:   . Fear of Current or Ex-Partner: Not on file  . Emotionally Abused: Not on file  . Physically Abused: Not on file  . Sexually Abused: Not on file    Observations/Objective:   Height 5' (1.524 m), weight 200 lb (90.7 kg). No acute distress.  Alert and oriented.  Speech fluent and not dysarthric.  Language intact.  Eyes orthophoric on primary gaze.  Face symmetric.  Assessment and Plan:   1.  Migraine without aura, without status migrainosus, not intractable, increased 2.  Vestibular migraine, increased 3.  Ocular migraine 4.  Episodic daytime somnolence.  It is worse over the past several months.    1.  For preventative management, increase topiramate to 75mg  twice daily; continue Aimovig 140mg  2.  For abortive therapy, Cafergot 3. Refer to sleep  medicine for evaluation of episodic daytime somnolence. 4.  Limit use of pain relievers to no more than 2 days out of week to prevent risk of rebound or medication-overuse headache. 5.  Keep headache diary 6.  Exercise, hydration, caffeine cessation, sleep hygiene, monitor for and avoid triggers 7. Follow up 4 months   Follow Up Instructions:    -I discussed the assessment and treatment plan with the patient. The patient was provided an opportunity to ask questions and all were answered. The patient agreed with the plan and demonstrated an understanding of the instructions.   The patient was advised to call back or seek an in-person evaluation if the symptoms worsen or if the condition fails to improve as anticipated.    Dudley Major, DO

## 2020-01-01 ENCOUNTER — Encounter: Payer: Self-pay | Admitting: Neurology

## 2020-01-02 ENCOUNTER — Other Ambulatory Visit: Payer: Self-pay

## 2020-01-02 ENCOUNTER — Telehealth (INDEPENDENT_AMBULATORY_CARE_PROVIDER_SITE_OTHER): Payer: BC Managed Care – PPO | Admitting: Neurology

## 2020-01-02 VITALS — Ht 60.0 in | Wt 200.0 lb

## 2020-01-02 DIAGNOSIS — R400 Somnolence: Secondary | ICD-10-CM | POA: Diagnosis not present

## 2020-01-02 DIAGNOSIS — G43109 Migraine with aura, not intractable, without status migrainosus: Secondary | ICD-10-CM

## 2020-01-02 DIAGNOSIS — G43709 Chronic migraine without aura, not intractable, without status migrainosus: Secondary | ICD-10-CM

## 2020-01-02 MED ORDER — TOPIRAMATE 50 MG PO TABS: 75.0000 mg | ORAL_TABLET | Freq: Two times a day (BID) | ORAL | 3 refills | Status: DC

## 2020-01-02 NOTE — Progress Notes (Signed)
Orders place for sleep medicine

## 2020-01-07 ENCOUNTER — Other Ambulatory Visit: Payer: Self-pay | Admitting: Neurology

## 2020-01-07 DIAGNOSIS — G43709 Chronic migraine without aura, not intractable, without status migrainosus: Secondary | ICD-10-CM

## 2020-01-23 ENCOUNTER — Other Ambulatory Visit: Payer: Self-pay

## 2020-01-23 ENCOUNTER — Encounter: Payer: Self-pay | Admitting: Neurology

## 2020-01-23 ENCOUNTER — Ambulatory Visit: Payer: BC Managed Care – PPO | Admitting: Neurology

## 2020-01-23 VITALS — BP 148/92 | HR 100 | Temp 97.8°F | Ht 60.0 in | Wt 202.0 lb

## 2020-01-23 DIAGNOSIS — G4752 REM sleep behavior disorder: Secondary | ICD-10-CM | POA: Diagnosis not present

## 2020-01-23 DIAGNOSIS — F518 Other sleep disorders not due to a substance or known physiological condition: Secondary | ICD-10-CM | POA: Diagnosis not present

## 2020-01-23 DIAGNOSIS — G478 Other sleep disorders: Secondary | ICD-10-CM | POA: Diagnosis not present

## 2020-01-23 DIAGNOSIS — G4719 Other hypersomnia: Secondary | ICD-10-CM | POA: Diagnosis not present

## 2020-01-23 DIAGNOSIS — F418 Other specified anxiety disorders: Secondary | ICD-10-CM | POA: Insufficient documentation

## 2020-01-23 DIAGNOSIS — R0683 Snoring: Secondary | ICD-10-CM | POA: Insufficient documentation

## 2020-01-23 MED ORDER — MODAFINIL 200 MG PO TABS: 200.0000 mg | ORAL_TABLET | Freq: Every day | ORAL | 5 refills | Status: DC

## 2020-01-23 NOTE — Addendum Note (Signed)
Addended by: Melvyn Novas on: 01/23/2020 12:29 PM   Modules accepted: Orders

## 2020-01-23 NOTE — Patient Instructions (Addendum)
Vanessa Huff presents with a very very active dreams, these are vivid that can happen repeatedly at night they cluster around the morning hours and are most likely REM sleep related. She is usually fighting kicking sometimes even biting, but she is not as much verbally active. At least once she has almost fallen out of bed but she is usually not sleepwalking. Further interest is that she has the irresistible urge to go to sleep and about 2 years ago was found not to have apnea which is the most common reason for hypersomnia. Her Epworth sleepiness score is endorsed at a level that would endorse narcolepsy. She has never been evaluated for narcolepsy before but she states that even when she takes just 5 minutes of sleep she will dream this sounds very much like a hypnagogic hypnopompic hallucination, she has not noted cataplexy. But dream intrusion, and she has experienced sleep paralysis.    My Plan is to proceed with:  1) PSG and MSLT at a later point. can't do MSLT right now due to medication - REM suppressant medications have all to be weaned off and she needs to be off for 3 weeks.  2) HLA narcolepsy panel   Weaning schedule-there are several medications here that would have to be weaned once the Elavil 100 mg and to be off Elavil for at least 3 weeks prior to a sleep study in the coming summer break she would need to wean to 50 mg then to 25 and then to every other day 25 then being off for 3 weeks. His BuSpar I would need you to be off for 3 weeks, but you do not have to wean off BuSpar you can discontinue the medication. Valium was not allowed to milligrams that this should not be in the system for 3 weeks prior I am awake is allowed your hormone treatment is allowed   Has caffeine in it by you allowed to treat your migraines halfway I do not want you to have caffeine for at least 4 days before the sleep study. Glucophage is allowed, Phenergan is also changing the sleep architecture you take it  for nausea so we should be off for at least 3 days, Topamax officially is allowed but I do still think that it also affects REM sleep we will discuss that in detail the very most concerning and hardest to been medication is Effexor Effexor causes withdrawal symptoms such as electric shock sensation sudden headaches and Effexor has a long half-life. So we should meet before the summer break to do not plan this in detail for right now I will only do a blood test if you have a genetic markers of narcolepsy.  I would like to thank Olena Mater, MD and [place], [place], [place] [address] [place] [address] for allowing me to meet with and to take care of this pleasant patient.   In short, Vanessa Huff is presenting with several narcolepsy typical symptoms.  I plan to follow up either personally or through our NP within 2 month, planing a weaning schedule. .   CC: I will share my notes with Dr.Jaffee.    Narcolepsy Narcolepsy is a neurological disorder that causes people to fall asleep suddenly and without control (have sleep attacks) during the daytime. It is a lifelong disorder. Narcolepsy disrupts the sleep cycle at night, which then causes daytime sleepiness. What are the causes? The cause of narcolepsy is not fully understood, but it may be related to:  Low  levels of hypocretin, a chemical (neurotransmitter) in the brain that controls sleep and wake cycles. Hypocretin imbalance may be caused by: ? Abnormal genes that are passed from parent to child (inherited). ? An autoimmune disease in which the body's defense system (immune system) attacks the brain cells that make hypocretin.  Infection, tumor, or injury in the area of the brain that controls sleep.  Exposure to poisons (toxins), such as heavy metals, pesticides, and secondhand smoke. What are the signs or symptoms? Symptoms of this condition include:  Excessive daytime sleepiness. This is the most common symptom and is  usually the first symptom you will notice. This may affect your performance at work or school.  Sleep attacks. You may fall asleep in the middle of an activity, especially low-energy activities like reading or watching TV.  Feeling like you cannot think clearly and trouble focusing or remembering things. You may also feel depressed.  Sudden muscle weakness (cataplexy). When this occurs, your speech may become slurred, or your knees may buckle. Cataplexy is usually triggered by surprise, anger, fear, or laughter.  Losing the ability to speak or move (sleep paralysis). This may occur just as you start to fall asleep or wake up. You will be aware of the paralysis. It usually lasts for just a few seconds or minutes.  Seeing, hearing, tasting, smelling, or feeling things that are not real (hallucinations). Hallucinations may occur with sleep paralysis. They can happen when you are falling asleep, waking up, or dozing.  Trouble staying asleep at night (insomnia) and restless sleep. How is this diagnosed? This condition may be diagnosed based on:  A physical exam to rule out any other problems that may be causing your symptoms. You may be asked to write down your sleeping patterns for several weeks in a sleep diary. This will help your health care provider make a diagnosis.  Sleep studies that measure how well your REM sleep is regulated. These tests also measure your heart rate, breathing, movement, and brain waves. These tests include: ? An overnight sleep study (polysomnogram). ? A daytime sleep study that is done while you take several naps during the day (multiple sleep latency test, MSLT). This test measures how quickly you fall asleep and how quickly you enter REM sleep.  Removal of spinal fluid to measure hypocretin levels. How is this treated? There is no cure for this condition, but treatment can help relieve symptoms. Treatment may include:  Lifestyle and sleeping strategies to help you  cope with the condition, such as: ? Exercising regularly. ? Maintaining a regular sleep schedule. ? Avoiding caffeine and large meals before bed.  Medicines. These may include: ? Medicines that help keep you awake and alert (stimulants) to fight daytime sleepiness. ? Medicines that treat depression (antidepressants). These may be used to treat cataplexy. ? Sodium oxybate. This is a strong medicine to help you relax (sedative) that you may take at night. It can help control daytime sleepiness and cataplexy.  Other treatments may include mental health counseling or joining a support group. Follow these instructions at home: Sleeping habits   Get about 8 hours of sleep every night.  Go to sleep and get up at about the same time every day.  Keep your bedroom dark, quiet, and comfortable.  When you feel very tired, take short naps. Schedule naps so that you take them at about the same time every day.  Before bedtime: ? Avoid bright lights and screens. ? Relax. Try activities like reading or  taking a warm bath. Activity  Get at least 20 minutes of exercise every day. This will help you sleep better at night and reduce daytime sleepiness.  Avoid exercising within 3 hours of bedtime.  Do not drive or use heavy machinery if you are sleepy. If possible, take a nap before driving.  Do not swim or go out on the water without a life jacket. Eating and drinking  Do not drink alcohol or caffeinated beverages within 4-5 hours of bedtime.  Do not eat a large meal before bedtime.  Eat meals at about the same times every day. General instructions   Take over-the-counter and prescription medicines only as told by your health care provider.  Keep a sleep diary as told by your health care provider.  Tell your employer or teachers that you have narcolepsy. You may be able to adjust your schedule to include time for naps.  Do not use any products that contain nicotine or tobacco, such as  cigarettes, e-cigarettes, and chewing tobacco. If you need help quitting, ask your health care provider.  Keep all follow-up visits as told by your health care provider. This is important. Where to find more information  [employer] of Neurological Disorders: [employer].it Contact a health care provider if:  Your symptoms are not getting better.  You have increasingly high blood pressure (hypertension).  You have changes in your heart rhythm.  You are having a hard time determining what is real and what is not (psychosis). Get help right away if you:  Hurt yourself during a sleep attack or an attack of cataplexy.  Have chest pain.  Have trouble breathing. These symptoms may represent a serious problem that is an emergency. Do not wait to see if the symptoms will go away. Get medical help right away. Call your local emergency services (911 in the [place] Do not drive yourself to the hospital. Summary  Narcolepsy is a neurological disorder that causes people to fall asleep suddenly, and without control, during the daytime (sleep attacks). It is a lifelong disorder.  There is no cure for this condition, but treatment can help relieve symptoms.  Go to sleep and get up at about the same time every day. Follow instructions about sleep and activities as told by your health care provider.  Take over-the-counter and prescription medicines only as told by your health care provider. This information is not intended to replace advice given to you by your health care provider. Make sure you discuss any questions you have with your health care provider. Document Revised: 06/05/2019 Document Reviewed: 06/05/2019 Elsevier Patient Education  [place].  Modafinil tablets What is this medicine? MODAFINIL (moe DAF i nil) is used to treat excessive sleepiness caused by certain sleep disorders. This includes narcolepsy, sleep apnea, and shift work sleep disorder. This medicine  may be used for other purposes; ask your health care provider or pharmacist if you have questions. COMMON BRAND NAME(S): Provigil What should I tell my health care provider before I take this medicine? They need to know if you have any of these conditions:  history of depression, mania, or other mental disorder  kidney disease  liver disease  an unusual or allergic reaction to modafinil, other medicines, foods, dyes, or preservatives  pregnant or trying to get pregnant  breast-feeding How should I use this medicine? Take this medicine by mouth with a glass of water. Follow the directions on the prescription label. Take your doses at regular intervals. Do not take  your medicine more often than directed. Do not stop taking except on your doctor's advice. A special MedGuide will be given to you by the pharmacist with each prescription and refill. Be sure to read this information carefully each time. Talk to your pediatrician regarding the use of this medicine in children. This medicine is not approved for use in children. Overdosage: If you think you have taken too much of this medicine contact a poison control center or emergency room at once. NOTE: This medicine is only for you. Do not share this medicine with others. What if I miss a dose? If you miss a dose, take it as soon as you can. If it is almost time for your next dose, take only that dose. Do not take double or extra doses. What may interact with this medicine? Do not take this medicine with any of the following medications:  amphetamine or dextroamphetamine  dexmethylphenidate or methylphenidate  medicines called MAO Inhibitors like Nardil, Parnate, Marplan, Eldepryl  pemoline  procarbazine This medicine may also interact with the following medications:  antifungal medicines like itraconazole or ketoconazole  barbiturates like phenobarbital  birth control pills or other hormone-containing birth control devices or  implants  carbamazepine  cyclosporine  diazepam  medicines for depression, anxiety, or psychotic disturbances  phenytoin  propranolol  triazolam  warfarin This list may not describe all possible interactions. Give your health care provider a list of all the medicines, herbs, non-prescription drugs, or dietary supplements you use. Also tell them if you smoke, drink alcohol, or use illegal drugs. Some items may interact with your medicine. What should I watch for while using this medicine? Visit your doctor or health care provider for regular checks on your progress. The full effects of this medicine may not be seen right away. This medicine may cause serious skin reactions. They can happen weeks to months after starting the medicine. Contact your health care provider right away if you notice fevers or flu-like symptoms with a rash. The rash may be red or purple and then turn into blisters or peeling of the skin. Or, you might notice a red rash with swelling of the face, lips or lymph nodes in your neck or under your arms. This medicine may affect your concentration, function, or may hide signs that you are tired. You may get dizzy. Do not drive, use machinery, or do anything that needs mental alertness until you know how this drug affects you. Alcohol can make you more dizzy and may interfere with your response to this medicine or your alertness. Avoid alcoholic drinks. Birth control pills may not work properly while you are taking this medicine. Talk to your doctor about using an extra method of birth control. It is unknown if the effects of this medicine will be increased by the use of caffeine. Caffeine is available in many foods, beverages, and medications. Ask your doctor if you should limit or change your intake of caffeine-containing products while on this medicine. What side effects may I notice from receiving this medicine? Side effects that you should report to your doctor or health  care professional as soon as possible:  allergic reactions like skin rash, itching or hives, swelling of the face, lips, or tongue  anxiety  breathing problems  chest pain  fast, irregular heartbeat  hallucinations  increased blood pressure  rash, fever, and swollen lymph nodes  redness, blistering, peeling or loosening of the skin, including inside the mouth  sore throat, fever, or chills  suicidal thoughts or other mood changes  tremors  vomiting Side effects that usually do not require medical attention (report to your doctor or health care professional if they continue or are bothersome):  headache  nausea, diarrhea, or stomach upset  nervousness  trouble sleeping This list may not describe all possible side effects. Call your doctor for medical advice about side effects. You may report side effects to FDA at 1-800-FDA-1088. Where should I keep my medicine? Keep out of the reach of children. This medicine can be abused. Keep your medicine in a safe place to protect it from theft. Do not share this medicine with anyone. Selling or giving away this medicine is dangerous and against the law. This medicine may cause accidental overdose and death if taken by other adults, children, or pets. Mix any unused medicine with a substance like cat litter or coffee grounds. Then throw the medicine away in a sealed container like a sealed bag or a coffee can with a lid. Do not use the medicine after the expiration date. Store at room temperature between 20 and 25 degrees C (68 and 77 degrees F). NOTE: This sheet is a summary. It may not cover all possible information. If you have questions about this medicine, talk to your doctor, pharmacist, or health care provider.  2020 Elsevier/Gold Standard (2019-01-30 10:08:08)

## 2020-01-23 NOTE — Progress Notes (Signed)
SLEEP MEDICINE CLINIC    Provider:  Larey Seat, MD  Primary Care Physician:  Olena Mater, MD [place] [address]     Referring Provider: Olena Mater, [place] [address]          Chief Complaint according to patient   Patient presents with:    . New Patient (Initial Visit)     pt with husband/son- rm 11. pt presents today to discuss her sleep concerns. she states that she will fall asleep at inappropriate times. she has had a SS 2019 which was negative for apnea. everytime she sits still she feels urge to fall asleep, including when driving. she describes having vivid dreams and acting these dreams out.       HISTORY OF PRESENT ILLNESS:  Vanessa Huff is a 44  year old Caucasian female patient seen here upon a referral on 01/23/2020 from Dr. Loretta Plume for a parasomnia evaluation.   Chief concern according to patient : " [name] reports that she has been acting out dreams for a very long time and always had vivid dreams sometimes so vivid she was not sure if it was reality or a dream that she just experienced.  She has an active dreams in which she seems to defend herself, fight bite and kick .  He has had panic attacks in most she also has a history of generalized anxiety disorder she is a type II diabetic and she had vestibular migraines that cause vertigo and nausea.  In order to treat her migraines she had been put on amitriptyline it did not work for the migraines but it reduced the dream activity level but less violent less long less intense. Her husband sometimes have to wake her actively out of these dreams, she has not injured herself in any of these activities she usually stays in bed.  These dreams may start to the outside noticeable as she becomes restless, there is no initial screen.  Her heart rate however will be rising, she will have palpitations and diaphoresis she sometimes drenched in sweat.  Her bed  can be very wet when she wakes up, but she did not have enuresis.  Headaches have been present since adolescence. She believes that the vivid dreams have been present all her life.  And now she has daytime somnolence she feels not refreshed and restored and rather worn when she wakes after an active night.  She describes her migraine headaches as mostly controlled now she does not wake from headaches nor does she wake with headaches in the morning.   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  She  has a past medical history of Chronic headaches and Depression. her headaches are migraines, vertigo associated- vestibular. Brain MRI have been negative.  The patient had the first sleep study in the year 2019 in [place] at the [facility] sleep center.  She was stating that she did sleep but she did not have parasomnias at night so none were captured.  With a result of an AHI ( Apnea Hypopnea index)  of less than 5.    Family medical /sleep history: no other family member on CPAP with OSA, father has hypersomnia.    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 she lives in a household with spouse and child. Pets are **  present. Tobacco use- quit 2008 with planned preganacy.   ETOH use - seldomly,  Caffeine intake in form of Coffee( none) Soda( none Tea ( none) and no energy drinks. Regular exercise in form of: Karate, martial arts .  6 days a week.  Hobbies : see above, crafts     Sleep habits are as follows: The patient's dinner time is between 8  PM. The patient goes to bed at 11 PM and continues to sleep for 5 hours, wakes for a single  bathroom break time at 5 AM.   On amitriptyline. The preferred sleep position is laterally and wakes up with her cat on her chest- supine., with the support of 1 pillow. Dreams are reportedly frequent/vivid - she enacts dreams  between 5 AM and 7 AM .  8 AM is the usual rise time. The patient wakes up with an alarm.   She reports not feeling refreshed or restored in AM, with symptoms such as dry mouth, morning headaches have been rare , and residual fatigue.  Naps are taken frequently, lasting from 1 -3 hours and are not refreshing.  She dreams during those. She goes straight into dream sleep.   Review of Systems: Out of a complete 14 system review, the patient complains of only the following symptoms, and all other reviewed systems are negative.:  She goes straight into dream sleep. Parasomnia.  Fatigue, sleepiness , yes-snoring, fragmented sleep, hypersomnia and enactment of dreams.  Afraid to fall asleep driving.    How likely are you to doze in the following situations: 0 = not likely, 1 = slight chance, 2 = moderate chance, 3 = high chance   Sitting and Reading?3 Watching Television?3 Sitting inactive in a public place (theater or meeting)?3 As a passenger in a car for an hour without a break?3 Lying down in the afternoon when circumstances permit?3 Sitting and talking to someone?2 Sitting quietly after lunch without alcohol?3 In a car, while stopped for a few minutes in traffic?2   Total = 22/ 24 points   The irristable urge to sleep +++  FSS endorsed at 29/ 63 points.   Social History   Socioeconomic History  . Marital status: Married    Spouse name: Glendell Docker  . Number of children: Not on file  . Years of education: Not on file  . Highest education level: 12th grade  Occupational History  . Occupation: Psychologist, forensic: [employer]  Tobacco Use  . Smoking status: Never Smoker  . Smokeless tobacco: Never Used  Substance and Sexual Activity  . Alcohol use: Never  . Drug use: Never  . Sexual activity: Not on file  Other Topics Concern  . Not on file  Social History Narrative   Patient is right-handed. She lives with her husband in a one story home. She avoids all caffeine. She  participates in karate and other MMA sports.   Social Determinants of Health   Financial Resource Strain:   . Difficulty of Paying Living Expenses:   Food Insecurity:   . Worried About Charity fundraiser in the Last Year:   . Arboriculturist in the Last Year:   Transportation Needs:   . Film/video editor (Medical):   Marland Kitchen Lack of Transportation (Non-Medical):   Physical Activity:   . Days of Exercise per Week:   . Minutes of Exercise per Session:   Stress:   . Feeling of Stress :   Social Connections:   .  Frequency of Communication with Friends and Family:   . Frequency of Social Gatherings with Friends and Family:   . Attends Religious Services:   . Active Member of Clubs or Organizations:   . Attends Archivist Meetings:   Marland Kitchen Marital Status:     Family History  Problem Relation Age of Onset  . Breast cancer Mother   . Lung cancer Father     Past Medical History:  Diagnosis Date  . Chronic headaches   . Depression     Past Surgical History:  Procedure Laterality Date  . CARPAL TUNNEL RELEASE Right      Current Outpatient Medications on File Prior to Visit  Medication Sig Dispense Refill  . AIMOVIG 140 MG/ML SOAJ ADMINISTER 1 ML UNDER THE SKIN EVERY 30 DAYS 1 mL 2  . amitriptyline (ELAVIL) 100 MG tablet TAKE 1 TABLET(100 MG) BY MOUTH AT BEDTIME 90 tablet 0  . busPIRone (BUSPAR) 10 MG tablet Take by mouth. 3 in the AM, 2-3 additional PRN    . diazepam (VALIUM) 2 MG tablet Take 1 tablet (2 mg total) by mouth every 4 (four) hours as needed. 30 tablet 0  . ergotamine-caffeine (CAFERGOT) 1-100 MG tablet 1 to 2 tablets at onset of migraine.  May repeat every 1h up to maximum of 4 tablets in 24h 30 tablet 3  . levonorgestrel-ethinyl estradiol (INTROVALE) 0.15-0.03 MG tablet Take 1 tablet by mouth daily.    . metFORMIN (GLUCOPHAGE) 850 MG tablet Take 850 mg by mouth 2 (two) times daily with a meal.     . promethazine (PHENERGAN) 25 MG tablet Take 25 mg by mouth  every 6 (six) hours as needed for nausea or vomiting.    . simvastatin (ZOCOR) 10 MG tablet Take by mouth.    . topiramate (TOPAMAX) 50 MG tablet Take 1.5 tablets (75 mg total) by mouth 2 (two) times daily. 90 tablet 3  . venlafaxine (EFFEXOR) 75 MG tablet Take 75 mg by mouth daily.    Marland Kitchen venlafaxine XR (EFFEXOR-XR) 150 MG 24 hr capsule Take 150 mg by mouth daily with breakfast.    . Vitamin D, Ergocalciferol, 50 MCG (2000 UT) CAPS Take 2,000 Units by mouth daily.      No current facility-administered medications on file prior to visit.    Allergies  Allergen Reactions  . Lortab [Hydrocodone-Acetaminophen]     nightmares  . Other     DHE - increased blood pressure    Physical exam:  Today's Vitals   01/23/20 1123  BP: (!) 148/92  Pulse: 100  Temp: 97.8 F (36.6 C)  Weight: 202 lb (91.6 kg)  Height: 5' (1.524 m)   Body mass index is 39.45 kg/m.   Wt Readings from Last 3 Encounters:  01/23/20 202 lb (91.6 kg)  01/01/20 200 lb (90.7 kg)  05/14/19 198 lb (89.8 kg)     Ht Readings from Last 3 Encounters:  01/23/20 5' (1.524 m)  01/01/20 5' (1.524 m)  05/14/19 '5\' 1"'$  (1.549 m)      General: The patient is awake, alert and appears not in acute distress. The patient is well groomed. Head: Normocephalic, atraumatic. Neck is supple. Mallampati 3,  neck circumference:16.5  inches . Nasal airflow partially congested  Post nasal drip. Marland Kitchen  Retrognathia is not  seen.  Dental status: biological, overbite.  Cardiovascular:  Regular rate and cardiac rhythm by pulse,  without distended neck veins. Respiratory: Lungs are clear to auscultation.  Skin:  Without evidence  of ankle edema, or rash. Trunk: The patient's posture is erect.   Lightheadedness/ vertigo with sudden rise or bending.    Neurologic exam : The patient is awake and alert, oriented to place and time.   Memory subjective described as intact.  Attention span & concentration ability appears normal.  Speech is fluent,   without  dysarthria, dysphonia or aphasia.  Mood and affect are appropriate.   Cranial nerves: no loss of smell or taste reported  Pupils are equal and briskly reactive to light. Funduscopic exam deferred .  Extraocular movements in vertical and horizontal planes were intact and without nystagmus. No Diplopia. Visual fields by finger perimetry are intact. Hearing was intact to soft voice and finger rubbing.    Facial sensation intact to fine touch.  Facial motor strength is symmetric and tongue and uvula move midline.  Neck ROM : rotation, tilt and flexion extension were normal for age and shoulder shrug was symmetrical.    Motor exam:  Symmetric bulk, tone and ROM.   Normal tone without cog wheeling, symmetric grip strength . Sensory:  Fine touch, pinprick and vibration were normal.  Proprioception tested in the upper extremities was normal. Coordination: Rapid alternating movements in the fingers/hands were of normal speed.  The Finger-to-nose maneuver was intact without evidence of ataxia, dysmetria or tremor. Gait and station: Patient could rise unassisted from a seated position, walked without assistive device.  Stance is of normal width/ base and the patient turned with 3 steps, as by RN observation..  Toe and heel walk were deferred.  Deep tendon reflexes: in the  upper and lower extremities are symmetric and intact.  Babinski response was deferred .      After spending a total time of  47  minutes face to face and  physical and neurologic examination, review of laboratory studies,  personal review of imaging studies, reports and results of other testing and review of referral information / records as far as provided in visit, I have established the following assessments:  Mrs. Strahm presents with a very very active dreams, these are vivid that can happen repeatedly at night they cluster around the morning hours and are most likely REM sleep related. She is usually fighting  kicking sometimes even biting, but she is not as much verbally active. At least once she has almost fallen out of bed but she is usually not sleepwalking. Further interest is that she has the irresistible urge to go to sleep and about 2 years ago was found not to have apnea which is the most common reason for hypersomnia. Her Epworth sleepiness score is endorsed at a level that would endorse narcolepsy. She has never been evaluated for narcolepsy before but she states that even when she takes just 5 minutes of sleep she will dream this sounds very much like a hypnagogic hypnopompic hallucination, she has not noted cataplexy. But dream intrusion, and she has experienced sleep paralysis.    My Plan is to proceed with:  1) PSG and MSLT at a later point. can't do MSLT right now due to medication - REM suppressant medications have all to be weaned off and she needs to be off for 3 weeks.  2) HLA narcolepsy panel   Weaning schedule-there are several medications here that would have to be weaned once the Elavil 100 mg and to be off Elavil for at least 3 weeks prior to a sleep study in the coming summer break she would need to wean to  50 mg then to 25 and then to every other day 25 then being off for 3 weeks. His BuSpar I would need you to be off for 3 weeks, but you do not have to wean off BuSpar you can discontinue the medication. Valium was not allowed to milligrams that this should not be in the system for 3 weeks prior-  I am awake is allowed your hormone treatment is allowed  I do not want you to have caffeine for at least 4 days before the sleep study. Glucophage is allowed, Phenergan is also changing the sleep architecture ; as you take it for nausea -so we should be off for at least 3 days, Topamax officially is allowed but I do still think that it also affects REM sleep we will discuss that in detail the very most concerning and hardest to been medication is Effexor . Effexor causes withdrawal symptoms  such as electric shock sensation sudden headaches and Effexor has a long half-life.    So we should meet before the summer break to do not plan this in detail for right now I will only do a blood test if you have a genetic markers of narcolepsy.  I would like to thank Dr. Loretta Plume for allowing me to meet with and to take care of this pleasant patient.   In short, Vanessa Huff is presenting with several narcolepsy typical symptoms.  I plan to follow up either personally or through our NP within 2 month, planing a weaning schedule. .   CC: I will share my notes with Dr.Jaffee.   Electronically signed by: Larey Seat, MD 01/23/2020 11:42 AM  [facility] Board certified by The [employer] of Sleep Medicine and Diplomate of the [employer] of Sleep Medicine. Board certified In Neurology through the [place], Fellow of the [employer] of Neurology. Medical Director of [facility].

## 2020-01-27 ENCOUNTER — Telehealth: Payer: Self-pay | Admitting: Neurology

## 2020-01-27 NOTE — Telephone Encounter (Signed)
PA completed for the patient for modafinil through [facility].  VCB:[id number] Placed for circadian sleep causing excessive daytime sleepiness, due to pt unable to truly test for narcolepsy at this time. PA will take up to 72 hrs.

## 2020-01-28 NOTE — Telephone Encounter (Signed)
PA approved until 07/29/2020 through [facility]

## 2020-01-30 LAB — NARCOLEPSY EVALUATION
DQA1*01:02: NEGATIVE
DQB1*06:02: NEGATIVE

## 2020-01-30 NOTE — Progress Notes (Signed)
DQA1*01:02 Negative  DQB1*06:02 Negative

## 2020-02-04 ENCOUNTER — Encounter: Payer: Self-pay | Admitting: Neurology

## 2020-02-04 NOTE — Progress Notes (Signed)
New key in CMM: [id number]; Aimovig medication; received fax that PA already approved for patient until 06/01/20. PA #: [id number]. Sent fax for scaning.

## 2020-02-06 ENCOUNTER — Other Ambulatory Visit: Payer: Self-pay | Admitting: Neurology

## 2020-03-07 ENCOUNTER — Other Ambulatory Visit: Payer: Self-pay | Admitting: Neurology

## 2020-03-09 ENCOUNTER — Other Ambulatory Visit: Payer: Self-pay | Admitting: Neurology

## 2020-03-18 ENCOUNTER — Other Ambulatory Visit: Payer: Self-pay

## 2020-03-18 MED ORDER — ERGOTAMINE-CAFFEINE 1-100 MG PO TABS: ORAL_TABLET | ORAL | 3 refills | Status: DC

## 2020-03-26 ENCOUNTER — Other Ambulatory Visit: Payer: Self-pay | Admitting: Neurology

## 2020-03-26 ENCOUNTER — Other Ambulatory Visit: Payer: Self-pay

## 2020-03-26 ENCOUNTER — Ambulatory Visit: Payer: BC Managed Care – PPO | Admitting: Family Medicine

## 2020-03-26 ENCOUNTER — Encounter: Payer: Self-pay | Admitting: Family Medicine

## 2020-03-26 VITALS — BP 138/95 | HR 91 | Ht 60.0 in | Wt 202.0 lb

## 2020-03-26 DIAGNOSIS — F419 Anxiety disorder, unspecified: Secondary | ICD-10-CM

## 2020-03-26 DIAGNOSIS — F518 Other sleep disorders not due to a substance or known physiological condition: Secondary | ICD-10-CM | POA: Diagnosis not present

## 2020-03-26 DIAGNOSIS — G4752 REM sleep behavior disorder: Secondary | ICD-10-CM | POA: Diagnosis not present

## 2020-03-26 DIAGNOSIS — G43709 Chronic migraine without aura, not intractable, without status migrainosus: Secondary | ICD-10-CM

## 2020-03-26 DIAGNOSIS — G4719 Other hypersomnia: Secondary | ICD-10-CM

## 2020-03-26 DIAGNOSIS — R400 Somnolence: Secondary | ICD-10-CM

## 2020-03-26 DIAGNOSIS — F329 Major depressive disorder, single episode, unspecified: Secondary | ICD-10-CM

## 2020-03-26 NOTE — Progress Notes (Signed)
PATIENT: Vanessa Huff DOB: June 03, 1976  REASON FOR VISIT: follow up HISTORY FROM: patient  Chief Complaint  Patient presents with  . Follow-up    rm 7, with husband and child, daytime sleepiness, pt states Modafinil helps some, no major improvements      HISTORY OF PRESENT ILLNESS: Today 03/26/20 Vanessa Huff is a 44 y.o. female here today for follow up for daytime sleepiness. She has started modafinil and feels that has helped a little. She continues to have excessive daytime sleepiness. She reports that she can fall asleep anytime, anywhere. Dreams and restless sleep continue. She truly feels she may have narcolepsy despite negative DQA/DQB. She has a long standing history of anxiety and depression as well and on several psychoactive medication. She was advised of specific medications that would need to be weaned at last visit. She is willing to wean these as she desires formal PSG/MSLT.   HISTORY: (copied from Dr Edwena Felty note on 01/23/2020)  Vanessa Huff a 44  year old Caucasian female patientseen here upon a referralon 01/23/2020 from Dr. Heidi Dach a parasomnia evaluation.  Chiefconcernaccording to patient : " Vanessa Huff reports that she has been acting out dreams for a very long time and always had vivid dreams sometimes so vivid she was not sure if it was reality or a dream that she just experienced.  She has an active dreams in which she seems to defend herself, fight bite and kick .  He has had panic attacks in most she also has a history of generalized anxiety disorder she is a type II diabetic and she had vestibular migraines that cause vertigo and nausea.  In order to treat her migraines she had been put on amitriptyline it did not work for the migraines but it reduced the dream activity level but less violent less long less intense. Her husband sometimes have to wake her actively out of these dreams, she has not injured herself in any of these activities  she usually stays in bed.  These dreams may start to the outside noticeable as she becomes restless, there is no initial screen.  Her heart rate however will be rising, she will have palpitations and diaphoresis she sometimes drenched in sweat.  Her bed can be very wet when she wakes up, but she did not have enuresis.  Headaches have been present since adolescence. She believes that the vivid dreams have been present all her life.  And now she has daytime somnolence she feels not refreshed and restored and rather worn when she wakes after an active night.  She describes her migraine headaches as mostly controlled now she does not wake from headaches nor does she wake with headaches in the morning.  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 She  has a past medical history of Chronic headaches and Depression. her headaches are migraines, vertigo associated- vestibular. Brain MRI have been negative.  The patient had the first sleep study in the year 2019 in [place] at the [facility] sleep center.  She was stating that she did sleep but she did not have parasomnias at night so none were captured.  With a result of an AHI ( Apnea Hypopnea index)  of less than 5.  Familymedical /sleep history:no other family member on CPAP with OSA, father has hypersomnia.  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 she lives in a household with  spouse and child. Pets are ** present. Tobacco use- quit 2008 with planned preganacy.  ETOH use - seldomly,  Caffeine intake in form of Coffee( none) Soda( none Tea ( none) and no energy drinks. Regular exercise in form of: Karate, martial arts .  6 days a week.  Hobbies : see above, crafts   Sleep habits are as follows:The patient's dinner time is between 8  PM. The patient goes to bed at 11 PM and continues to sleep for 5 hours,  wakes for a single  bathroom break time at 5 AM.   On amitriptyline. The preferred sleep position is laterally and wakes up with her cat on her chest- supine., with the support of 1 pillow. Dreams are reportedly frequent/vivid - she enacts dreams between 5 AM and 7 AM .  8 AM is the usual rise time. The patient wakes up with an alarm.   She reports not feeling refreshed or restored in AM, with symptoms such as dry mouth, morning headaches have been rare , and residual fatigue.  Naps are taken frequently, lasting from 1 -3 hours and are not refreshing.  She dreams during those. She goes straight into dream sleep.   REVIEW OF SYSTEMS: Out of a complete 14 system review of symptoms, the patient complains only of the following symptoms, anxiety, depression, panic attacks, excessive sleepiness, headaches and all other reviewed systems are negative.  ESS: 16 FSS: 36  ALLERGIES: Allergies  Allergen Reactions  . Lortab [Hydrocodone-Acetaminophen]     nightmares  . Other     DHE - increased blood pressure    HOME MEDICATIONS: Outpatient Medications Prior to Visit  Medication Sig Dispense Refill  . AIMOVIG 140 MG/ML SOAJ ADMINISTER 1 ML UNDER THE SKIN EVERY 30 DAYS 1 mL 2  . amitriptyline (ELAVIL) 100 MG tablet TAKE 1 TABLET BY MOUTH EVERY DAY AT BEDTIME 30 tablet 1  . busPIRone (BUSPAR) 10 MG tablet Take by mouth. 3 in the AM, 2-3 additional PRN    . diazepam (VALIUM) 2 MG tablet Take 1 tablet (2 mg total) by mouth every 4 (four) hours as needed. 30 tablet 0  . ergotamine-caffeine (CAFERGOT) 1-100 MG tablet 1 to 2 tablets at onset of migraine.  May repeat every 1h up to maximum of 4 tablets in 24h 30 tablet 3  . levonorgestrel-ethinyl estradiol (INTROVALE) 0.15-0.03 MG tablet Take 1 tablet by mouth daily.    . metFORMIN (GLUCOPHAGE) 850 MG tablet Take 850 mg by mouth 2 (two) times daily with a meal.     . modafinil (PROVIGIL) 200 MG tablet Take 1 tablet (200 mg total) by mouth daily. 30 tablet  5  . promethazine (PHENERGAN) 25 MG tablet Take 25 mg by mouth every 6 (six) hours as needed for nausea or vomiting.    . simvastatin (ZOCOR) 10 MG tablet Take by mouth.    . topiramate (TOPAMAX) 50 MG tablet TAKE 1 TABLET(50 MG) BY MOUTH TWICE DAILY 180 tablet 1  . venlafaxine (EFFEXOR) 75 MG tablet Take 75 mg by mouth daily.    Marland Kitchen venlafaxine XR (EFFEXOR-XR) 150 MG 24 hr capsule Take 150 mg by mouth daily with breakfast.    . Vitamin D, Ergocalciferol, 50 MCG (2000 UT) CAPS Take 2,000 Units by mouth daily.      No facility-administered medications prior to visit.    PAST MEDICAL HISTORY: Past Medical History:  Diagnosis Date  . Chronic headaches   . Depression     PAST SURGICAL HISTORY:  Past Surgical History:  Procedure Laterality Date  . CARPAL TUNNEL RELEASE Right     FAMILY HISTORY: Family History  Problem Relation Age of Onset  . Breast cancer Mother   . Lung cancer Father     SOCIAL HISTORY: Social History   Socioeconomic History  . Marital status: Married    Spouse name: Baldo Ash  . Number of children: Not on file  . Years of education: Not on file  . Highest education level: 12th grade  Occupational History  . Occupation: Conservation officer, nature: [employer]  Tobacco Use  . Smoking status: Never Smoker  . Smokeless tobacco: Never Used  Substance and Sexual Activity  . Alcohol use: Never  . Drug use: Never  . Sexual activity: Not on file  Other Topics Concern  . Not on file  Social History Narrative   Patient is right-handed. She lives with her husband in a one story home. She avoids all caffeine. She participates in karate and other MMA sports.   Social Determinants of Health   Financial Resource Strain:   . Difficulty of Paying Living Expenses:   Food Insecurity:   . Worried About Programme researcher, broadcasting/film/video in the Last Year:   . Barista in the Last Year:   Transportation Needs:   . Freight forwarder (Medical):   Marland Kitchen Lack of Transportation  (Non-Medical):   Physical Activity:   . Days of Exercise per Week:   . Minutes of Exercise per Session:   Stress:   . Feeling of Stress :   Social Connections:   . Frequency of Communication with Friends and Family:   . Frequency of Social Gatherings with Friends and Family:   . Attends Religious Services:   . Active Member of Clubs or Organizations:   . Attends Banker Meetings:   Marland Kitchen Marital Status:   Intimate Partner Violence:   . Fear of Current or Ex-Partner:   . Emotionally Abused:   Marland Kitchen Physically Abused:   . Sexually Abused:       PHYSICAL EXAM  Vitals:   03/26/20 1121  BP: (!) 138/95  Pulse: 91  Weight: 202 lb (91.6 kg)  Height: 5' (1.524 m)   Body mass index is 39.45 kg/m.  Generalized: Well developed, in no acute distress  Cardiology: normal rate and rhythm, no murmur noted Respiratory: clear to auscultation bilaterally  Neurological examination  Mentation: Alert oriented to time, place, history taking. Follows all commands speech and language fluent Cranial nerve II-XII: Pupils were equal round reactive to light. Extraocular movements were full, visual field were full  Motor: The motor testing reveals 5 over 5 strength of all 4 extremities. Good symmetric motor tone is noted throughout.  Gait and station: Gait is normal.   DIAGNOSTIC DATA (LABS, IMAGING, TESTING) - I reviewed patient records, labs, notes, testing and imaging myself where available.  No flowsheet data found.   No results found for: WBC, HGB, HCT, MCV, PLT No results found for: NA, K, CL, CO2, GLUCOSE, BUN, CREATININE, CALCIUM, PROT, ALBUMIN, AST, ALT, ALKPHOS, BILITOT, GFRNONAA, GFRAA No results found for: CHOL, HDL, LDLCALC, LDLDIRECT, TRIG, CHOLHDL No results found for: FGHW2X No results found for: VITAMINB12 No results found for: TSH     ASSESSMENT AND PLAN 44 y.o. year old female  has a past medical history of Chronic headaches and Depression. here with      ICD-10-CM   1. Excessive daytime sleepiness  G47.19  2. Daytime somnolence  R40.0   3. Abnormal dreams  F51.8   4. Dream enactment behavior  G47.52   5. Chronic migraine without aura without status migrainosus, not intractable  G43.709   6. Anxiety and depression  F41.9    F32.9     Vanessa Huff continues to have concerns of excessive daytime sleepiness and is most concerned with the potential diagnosis of narcolepsy.  Modafinil has helped somewhat.  We will continue 200 mg daily.  She has requested we help her wean medications in order to obtain PSG/MSLT.  Dr. Vickey Huger had discussed medications that needed to be weaned at her last visit.  I have reviewed these medications with her.  I have provided her with information documented by Dr. Vickey Huger at her last visit for weaning schedule.  Effexor will most likely be the hardest medication to wean.  I will ask for specific guidance by Dr. Vickey Huger as well as her psychiatrist for weaning of this medication.  She is currently taking 225 mg daily.  She has been on this medication for greater than 10 years.  I suspect that we will need to wean this medication much lower than the others.  I will call her with a specific recommendation after discussing with Dr. Vickey Huger.  She was encouraged to continue reaching out to primary care and her psychiatrist for assistance in weaning medications as well.  We have discussed negative gene testing but patient expressing a desire to wean medications in order to have sleep testing.  She will continue healthy lifestyle habits with well-balanced diet regular exercise and adequate hydration.  She will follow-up pending weaning and sleep testing.  She verbalizes understanding and agreement with this plan.   No orders of the defined types were placed in this encounter.    No orders of the defined types were placed in this encounter.     I spent 15 minutes with the patient. 50% of this time was spent counseling and educating  patient on plan of care and medications.    Shawnie Dapper, FNP-C 03/26/2020, 12:38 PM [facility] [address] [place], [place] [address] [phone]

## 2020-03-26 NOTE — Patient Instructions (Addendum)
Dr Dohmeier's plan is to proceed with:  1) We need to consider PSG and MSLT - REM suppressant medications have all to be weaned off and she needs to be off for 3 weeks.  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   Weaning schedule- 1) Elavil 100 mg and to be off Elavil for at least 3 weeks prior to a sleep study in the coming summer break she would need to wean to 50 mg then to 25 and then to every other day 25 then being off for 3 weeks. 2) BuSpar I would need you to be off for 3 weeks, but you do not have to wean off BuSpar you can discontinue the medication.  3)Valium PRN- this should not be in the system for 3 weeks prior  4) I do not want you to have caffeine for at least 4 days before the sleep study.  5) Phenergan is also changing the sleep architecture you take it for nausea so we should be off for at least 3 days 6) Topamax officially is allowed but I do still think that it also affects REM sleep  7) Effexor- Effexor causes withdrawal symptoms such as electric shock sensation sudden headaches and Effexor has a long half-life. You have been on this medication for years so taper may need to be a little slower. I will speak with Dr Brett Fairy regarding suggestions. We may also need input from psychiatry as you have taken this for > 10 years.    Hypersomnia Hypersomnia is a condition in which a person feels very tired during the day even though he or she gets plenty of sleep at night. A person with this condition may take naps during the day and may find it very difficult to wake up from sleep. Hypersomnia may affect a person's ability to think, concentrate, drive, or remember things. What are the causes? The cause of this condition may not be known. Possible causes include:  Certain medicines.  Sleep disorders, such as narcolepsy and sleep apnea.  Injury to the head, brain, or spinal cord.  Drug or alcohol use.  Gastroesophageal reflux disease (GERD).  Tumors.  Certain  medical conditions, such as depression, diabetes, or an underactive thyroid gland (hypothyroidism). What are the signs or symptoms? The main symptoms of hypersomnia include:  Feeling very tired throughout the day, regardless of how much sleep you got the night before.  Having trouble waking up. Others may find it difficult to wake you up when you are sleeping.  Sleeping for longer and longer periods at a time.  Taking naps throughout the day. Other symptoms may include:  Feeling restless, anxious, or annoyed.  Lacking energy.  Having trouble with: ? Remembering. ? Speaking. ? Thinking.  Loss of appetite.  Seeing, hearing, tasting, smelling, or feeling things that are not real (hallucinations). How is this diagnosed? This condition may be diagnosed based on:  Your symptoms and medical history.  Your sleeping habits. Your health care provider may ask you to write down your sleeping habits in a daily sleep log, along with any symptoms you have.  A series of tests that are done while you sleep (sleep study or polysomnogram).  A test that measures how quickly you can fall asleep during the day (daytime nap study or multiple sleep latency test). How is this treated? Treatment can help you manage your condition. Treatment may include:  Following a regular sleep routine.  Lifestyle changes, such as changing your eating habits, getting regular  exercise, and avoiding alcohol or caffeinated beverages.  Taking medicines to make you more alert (stimulants) during the day.  Treating any underlying medical causes of hypersomnia. Follow these instructions at home: Sleep routine   Schedule the same bedtime and wake-up time each day.  Practice a relaxing bedtime routine. This may include reading, meditation, deep breathing, or taking a warm bath before going to sleep.  Get regular exercise each day. Avoid strenuous exercise in the evening hours.  Keep your sleep environment at a  cooler temperature, darkened, and quiet.  Sleep with pillows and a mattress that are comfortable and supportive.  Schedule short 20-minute naps for when you feel sleepiest during the day.  Talk with your employer or teachers about your hypersomnia. If possible, adjust your schedule so that: ? You have a regular daytime work schedule. ? You can take a scheduled nap during the day. ? You do not have to work or be active at night.  Do not eat a heavy meal for a few hours before bedtime. Eat your meals at about the same times every day.  Avoid drinking alcohol or caffeinated beverages. Safety   Do not drive or use heavy machinery if you are sleepy. Ask your health care provider if it is safe for you to drive.  Wear a life jacket when swimming or spending time near water. General instructions  Take supplements and over-the-counter and prescription medicines only as told by your health care provider.  Keep a sleep log that will help your doctor manage your condition. This may include information about: ? What time you go to bed each night. ? How often you wake up at night. ? How many hours you sleep at night. ? How often and for how long you nap during the day. ? Any observations from others, such as leg movements during sleep, sleep walking, or snoring.  Keep all follow-up visits as told by your health care provider. This is important. Contact a health care provider if:  You have new symptoms.  Your symptoms get worse. Get help right away if:  You have serious thoughts about hurting yourself or someone else. If you ever feel like you may hurt yourself or others, or have thoughts about taking your own life, get help right away. You can go to your nearest emergency department or call:  Your local emergency services (911 in the [place]  A suicide crisis helpline, such as the [place] at 314 721 1471. This is open 24 hours a day. Summary  Hypersomnia  refers to a condition in which you feel very tired during the day even though you get plenty of sleep at night.  A person with this condition may take naps during the day and may find it very difficult to wake up from sleep.  Hypersomnia may affect a person's ability to think, concentrate, drive, or remember things.  Treatment, such as following a regular sleep routine and making some lifestyle changes, can help you manage your condition. This information is not intended to replace advice given to you by your health care provider. Make sure you discuss any questions you have with your health care provider. Document Revised: 10/26/2017 Document Reviewed: 10/26/2017 Elsevier Patient Education  [place].   Narcolepsy Narcolepsy is a neurological disorder that causes people to fall asleep suddenly and without control (have sleep attacks) during the daytime. It is a lifelong disorder. Narcolepsy disrupts the sleep cycle at night, which then causes daytime sleepiness. What are  the causes? The cause of narcolepsy is not fully understood, but it may be related to:  Low levels of hypocretin, a chemical (neurotransmitter) in the brain that controls sleep and wake cycles. Hypocretin imbalance may be caused by: ? Abnormal genes that are passed from parent to child (inherited). ? An autoimmune disease in which the body's defense system (immune system) attacks the brain cells that make hypocretin.  Infection, tumor, or injury in the area of the brain that controls sleep.  Exposure to poisons (toxins), such as heavy metals, pesticides, and secondhand smoke. What are the signs or symptoms? Symptoms of this condition include:  Excessive daytime sleepiness. This is the most common symptom and is usually the first symptom you will notice. This may affect your performance at work or school.  Sleep attacks. You may fall asleep in the middle of an activity, especially low-energy activities like  reading or watching TV.  Feeling like you cannot think clearly and trouble focusing or remembering things. You may also feel depressed.  Sudden muscle weakness (cataplexy). When this occurs, your speech may become slurred, or your knees may buckle. Cataplexy is usually triggered by surprise, anger, fear, or laughter.  Losing the ability to speak or move (sleep paralysis). This may occur just as you start to fall asleep or wake up. You will be aware of the paralysis. It usually lasts for just a few seconds or minutes.  Seeing, hearing, tasting, smelling, or feeling things that are not real (hallucinations). Hallucinations may occur with sleep paralysis. They can happen when you are falling asleep, waking up, or dozing.  Trouble staying asleep at night (insomnia) and restless sleep. How is this diagnosed? This condition may be diagnosed based on:  A physical exam to rule out any other problems that may be causing your symptoms. You may be asked to write down your sleeping patterns for several weeks in a sleep diary. This will help your health care provider make a diagnosis.  Sleep studies that measure how well your REM sleep is regulated. These tests also measure your heart rate, breathing, movement, and brain waves. These tests include: ? An overnight sleep study (polysomnogram). ? A daytime sleep study that is done while you take several naps during the day (multiple sleep latency test, MSLT). This test measures how quickly you fall asleep and how quickly you enter REM sleep.  Removal of spinal fluid to measure hypocretin levels. How is this treated? There is no cure for this condition, but treatment can help relieve symptoms. Treatment may include:  Lifestyle and sleeping strategies to help you cope with the condition, such as: ? Exercising regularly. ? Maintaining a regular sleep schedule. ? Avoiding caffeine and large meals before bed.  Medicines. These may include: ? Medicines that  help keep you awake and alert (stimulants) to fight daytime sleepiness. ? Medicines that treat depression (antidepressants). These may be used to treat cataplexy. ? Sodium oxybate. This is a strong medicine to help you relax (sedative) that you may take at night. It can help control daytime sleepiness and cataplexy.  Other treatments may include mental health counseling or joining a support group. Follow these instructions at home: Sleeping habits   Get about 8 hours of sleep every night.  Go to sleep and get up at about the same time every day.  Keep your bedroom dark, quiet, and comfortable.  When you feel very tired, take short naps. Schedule naps so that you take them at about the same time  every day.  Before bedtime: ? Avoid bright lights and screens. ? Relax. Try activities like reading or taking a warm bath. Activity  Get at least 20 minutes of exercise every day. This will help you sleep better at night and reduce daytime sleepiness.  Avoid exercising within 3 hours of bedtime.  Do not drive or use heavy machinery if you are sleepy. If possible, take a nap before driving.  Do not swim or go out on the water without a life jacket. Eating and drinking  Do not drink alcohol or caffeinated beverages within 4-5 hours of bedtime.  Do not eat a large meal before bedtime.  Eat meals at about the same times every day. General instructions   Take over-the-counter and prescription medicines only as told by your health care provider.  Keep a sleep diary as told by your health care provider.  Tell your employer or teachers that you have narcolepsy. You may be able to adjust your schedule to include time for naps.  Do not use any products that contain nicotine or tobacco, such as cigarettes, e-cigarettes, and chewing tobacco. If you need help quitting, ask your health care provider.  Keep all follow-up visits as told by your health care provider. This is important. Where to  find more information  [employer] of Neurological Disorders: [employer] Contact a health care provider if:  Your symptoms are not getting better.  You have increasingly high blood pressure (hypertension).  You have changes in your heart rhythm.  You are having a hard time determining what is real and what is not (psychosis). Get help right away if you:  Hurt yourself during a sleep attack or an attack of cataplexy.  Have chest pain.  Have trouble breathing. These symptoms may represent a serious problem that is an emergency. Do not wait to see if the symptoms will go away. Get medical help right away. Call your local emergency services (911 in the [place] Do not drive yourself to the hospital. Summary  Narcolepsy is a neurological disorder that causes people to fall asleep suddenly, and without control, during the daytime (sleep attacks). It is a lifelong disorder.  There is no cure for this condition, but treatment can help relieve symptoms.  Go to sleep and get up at about the same time every day. Follow instructions about sleep and activities as told by your health care provider.  Take over-the-counter and prescription medicines only as told by your health care provider. This information is not intended to replace advice given to you by your health care provider. Make sure you discuss any questions you have with your health care provider. Document Revised: 06/05/2019 Document Reviewed: 06/05/2019 Elsevier Patient Education  [place].

## 2020-04-01 ENCOUNTER — Other Ambulatory Visit: Payer: Self-pay

## 2020-04-01 MED ORDER — AMITRIPTYLINE HCL 100 MG PO TABS: 100.0000 mg | ORAL_TABLET | Freq: Every day | ORAL | 0 refills | Status: DC

## 2020-04-02 ENCOUNTER — Telehealth: Payer: Self-pay | Admitting: Family Medicine

## 2020-04-02 NOTE — Telephone Encounter (Signed)
Can you please let patient know that I have discussed case with Dr Vickey Huger. We can wean Effexor as directed:   Decrease to 150mg  daily for 7-14 days. If well tolerated, she can decrease dose to 75mg  daily for 7-14 days. She can then go to 75mg  every other day for 1 week. Then 75mg  every 3rd day for 1 week. She can then stop. If she has trouble with side effects we can wean slower. We have to have her off medication for 3 weeks prior to testing. TY!

## 2020-04-02 NOTE — Telephone Encounter (Signed)
-----   Message from Melvyn Novas, MD sent at 03/27/2020 11:07 AM EDT ----- Regarding: MSLT  Dear Linton Rump,  MSLT is invalid on Effexor, Cymbalta, any SSRI, Tricyclic( including Flexaril), benzodiazepines or stimulants.  Not a short list...  Effexor is hard to wean and withdrawal is often associated with painful, shock-like headaches and spine sensations.  Metabolite-Half life is officially only 13 hours, yet Effexor metabolites are often found in our urine tox screen for weeks!   Plan: start by reducing Effexor to half dose ASAP , 50% reduction in daily dose for 8 days. (If the 50% dose is still 100 mg a day or more- add another week of another 50% reduction) Second week start taking Effexor every other day, at reduced dose. Third week --the patient can stop Effexor/ venlafaxine - exception : patient may report tearfulness, headaches, abnormal sensation and suicidal ideation), in that case reduce to lowest produced daily dose every other day- that's 37.5mg  .  After 14-21 days totally off Effexor, the MSLT should be valid.    MSLT to be ordered after that period, and first nap 120 minutes after morning wake time- I know sleep techs like to go with 90 minutes until first nap and can go home earlier, but the patient's chances are better with 2 hour wait before first nap.  Just put that request in your MSLT order, please.  PS : A psychiatrist may want not to restart Effexor after that weaning process. It's worth asking  Thanks,  Percell Miller.

## 2020-04-09 ENCOUNTER — Telehealth: Payer: Self-pay

## 2020-04-09 NOTE — Telephone Encounter (Signed)
LVM for pt to call me back to schedule sleep study  

## 2020-04-14 ENCOUNTER — Other Ambulatory Visit: Payer: Self-pay | Admitting: Neurology

## 2020-04-14 MED ORDER — TOPIRAMATE 50 MG PO TABS: ORAL_TABLET | ORAL | 1 refills | Status: DC

## 2020-04-14 MED ORDER — AMITRIPTYLINE HCL 25 MG PO TABS
ORAL_TABLET | ORAL | 21 refills | Status: DC
Start: 2020-04-14 — End: 2022-01-05

## 2020-05-04 ENCOUNTER — Encounter: Payer: Self-pay | Admitting: Neurology

## 2020-05-04 NOTE — Progress Notes (Addendum)
Vanessa Huff (Key: [id number]) Aimovig 140MG /ML auto-injectors   Form [facility] Electronic Prior Authorization Form (2017 NCPDP) Created 1 day ago Sent to Plan 1 hour ago Plan Response 1 hour ago Submit Clinical Questions 1 hour ago Determination Favorable 44 minutes ago Message from Plan Request Reference Number: [id number]. AIMOVIG INJ 140MG /ML is approved through 05/04/2021. Your patient may now fill this prescription and it will be covered.

## 2020-05-04 NOTE — Progress Notes (Signed)
NEUROLOGY FOLLOW UP OFFICE NOTE  Vanessa Huff [id number]  HISTORY OF PRESENT ILLNESS: Vanessa Huff is a 44 year old woman with depression, generalized anxiety disorder, type 2 diabetes and vestibular migraine who follows up for migraines.  UPDATE: Increased topiramate to 75mg  twice daily which helped but now tapering off of topiramate as well as most of her other medications (amitriptyline, venlafaxine) for upcoming sleep study, which has caused increased dizziness.  She is unable to work and will be planning to take time out of work until after her sleep study when she can restart some of her mediations.  No migraine headaches in several months.  Rescue protocol for common migraine: Cafergot; phenrgan for nausea Rescue protocol for vertigo: Diazepam and Dramamine Frequency of abortive medication:Cafergot every 2 to 3 weeks Current NSAIDS:Ibuprofen (rarely, usually just deals with the headache) Current analgesics:none Current triptans:none Current ergotamine:Cafergot Current anti-emetic:Phenergan 25mg  Current muscle relaxants:none Current anti-anxiolytic:Diazepam 2 mg every 4 hours as needed for acute vertigo, buspirone 10mg  2-3 tablets daily Current sleep aide:none Current Antihypertensive medications:none Current Antidepressant medications:Venlafaxine XR; amitriptyline Current Anticonvulsant medications:topirmate 75mg  twice daily Current anti-CGRP:Aimovig 140 mg Current Vitamins/Herbal/Supplements:D2 Current Antihistamines/Decongestants:none Other therapy:None Hormone/birth control: INTROVA-LE Other medications:  Provigil  Caffeine:No coffee or soda Diet:Only water Exercise:Karate, kickboxing Depression:stable; Anxiety:yes Other pain:no Sleep hygiene:Good with amitriptyline. She "acts out" in her dreams. She had a sleep study but she wasn't symptomatic that night. She woke up multiple times.She was referred to  sleep medicine at [facility].  Plan is to evaluate for narcolepsy.  Genetic testing negative.  However, she would need to taper off of amitriptyline for PSG/MSLT.  Weaning off of topiramate, amitriptyline and venlafaxine.  Sleep study scheduled on August 2.  HISTORY: Onset: Since adolescence Location:right temple Quality:Usually non-throbbing ache, sometimes stabbing Initial intensity:Usually mild-moderate, then sometimes severe.Shedenies new headache, thunderclap headache  Aura:no Prodrome:no Postdrome:no Associated symptoms: Nausea, vertigo, photophobia, phonophobia.Shedenies associated visual disturbance orunilateral numbness or weakness. Initial duration:6-8 hours InitialFrequency:Severe ones occur every 2 to 3 months. Otherwise a daily headache InitialFrequency of abortive medication:rarely Triggers: Emotional stress, laying down Relieving factors: Nothing Activity:Can't function for severe ones She also has vertigo without headache, lasting 1 day (occasinally a week). Occurs almost everyday. Meclizine ineffective. She also reports episodes consistent with ocular migraines whereshe sees flashing lights or squiggly lines that occur without headache. They are brief, a few seconds and only has occurred about once a month.  Past NSAIDS:Aleve Past analgesics:Excedrin Migraine, Tylenol, Fioricet Past abortive triptans:Sumatriptan tablet (ineffective) Past abortive ergotamine:none Past muscle relaxants:none Past anti-emetic:none Past antihypertensive medications:none Past antidepressant medicatimions:none Past anticonvulsant medications:topiramate 100mg  (ineffective) Past anti-CGRP:none Past vitamins/Herbal/Supplements:none Past antihistamines/decongestants:none Other pastheadache cocktails: Decadron/toradol/benadryl/reglan, Zofran and morphine (both ineffective) Other past treatment: Meclizine (ineffective)  Family history of  headache:Son, mom  06/07/2019 MRI BRAIN W WO:  There is no evidence of acute infarct, intracranial hemorrhage, mass, midline shift, or extra-axial fluid collection.  The ventricles and sulci are normal.  A few punctate foci of T2 FLAIR hyperintensity in the cerebral white matter are nonspecific and not considered abnormal for age.  No abnormal enhancement is identified.  Vascular:  Major intracranial vascular flow voids are preserved.   PAST MEDICAL HISTORY: Past Medical History:  Diagnosis Date   Chronic headaches    Depression     MEDICATIONS: Current Outpatient Medications on File Prior to Visit  Medication Sig Dispense Refill   AIMOVIG 140 MG/ML SOAJ ADMINISTER 1 ML UNDER THE SKIN EVERY 30 DAYS 1 mL 2   amitriptyline (ELAVIL) 25 MG  tablet Take 2 tablets at bedtime for a week, then 1 tablet at bedtime for a week, then STOP 30 tablet 21   busPIRone (BUSPAR) 10 MG tablet Take by mouth. 3 in the AM, 2-3 additional PRN     diazepam (VALIUM) 2 MG tablet Take 1 tablet (2 mg total) by mouth every 4 (four) hours as needed. 30 tablet 0   ergotamine-caffeine (CAFERGOT) 1-100 MG tablet 1 to 2 tablets at onset of migraine.  May repeat every 1h up to maximum of 4 tablets in 24h 30 tablet 3   levonorgestrel-ethinyl estradiol (INTROVALE) 0.15-0.03 MG tablet Take 1 tablet by mouth daily.     metFORMIN (GLUCOPHAGE) 850 MG tablet Take 850 mg by mouth 2 (two) times daily with a meal.      modafinil (PROVIGIL) 200 MG tablet Take 1 tablet (200 mg total) by mouth daily. 30 tablet 5   promethazine (PHENERGAN) 25 MG tablet Take 25 mg by mouth every 6 (six) hours as needed for nausea or vomiting.     simvastatin (ZOCOR) 10 MG tablet Take by mouth.     topiramate (TOPAMAX) 50 MG tablet TAKE 1 TABLET(50 MG) BY MOUTH TWICE DAILY 180 tablet 1   venlafaxine (EFFEXOR) 75 MG tablet Take 75 mg by mouth daily.     venlafaxine XR (EFFEXOR-XR) 150 MG 24 hr capsule Take 150 mg by mouth daily with  breakfast.     Vitamin D, Ergocalciferol, 50 MCG (2000 UT) CAPS Take 2,000 Units by mouth daily.      No current facility-administered medications on file prior to visit.    ALLERGIES: Allergies  Allergen Reactions   Lortab [Hydrocodone-Acetaminophen]     nightmares   Other     DHE - increased blood pressure    FAMILY HISTORY: Family History  Problem Relation Age of Onset   Breast cancer Mother    Lung cancer Father    SOCIAL HISTORY: Social History   Socioeconomic History   Marital status: Married    Spouse name: Glendell Docker   Number of children: Not on file   Years of education: Not on file   Highest education level: 12th grade  Occupational History   Occupation: Psychologist, forensic: [employer]  Tobacco Use   Smoking status: Never Smoker   Smokeless tobacco: Never Used  Scientific laboratory technician Use: Never used  Substance and Sexual Activity   Alcohol use: Never   Drug use: Never   Sexual activity: Not on file  Other Topics Concern   Not on file  Social History Narrative   Patient is right-handed. She lives with her husband in a one story home. She avoids all caffeine. She participates in karate and other MMA sports.   Social Determinants of Health   Financial Resource Strain:    Difficulty of Paying Living Expenses:   Food Insecurity:    Worried About Charity fundraiser in the Last Year:    Arboriculturist in the Last Year:   Transportation Needs:    Film/video editor (Medical):    Lack of Transportation (Non-Medical):   Physical Activity:    Days of Exercise per Week:    Minutes of Exercise per Session:   Stress:    Feeling of Stress :   Social Connections:    Frequency of Communication with Friends and Family:    Frequency of Social Gatherings with Friends and Family:    Attends Religious Services:    Active  Member of Clubs or Organizations:    Attends Engineer, structural:    Marital Status:     Intimate Partner Violence:    Fear of Current or Ex-Partner:    Emotionally Abused:    Physically Abused:    Sexually Abused:      PHYSICAL EXAM: Blood pressure 132/90, pulse 94, resp. rate 16, height 5' (1.524 m), weight 198 lb 6.4 oz (90 kg), SpO2 99 %. General: No acute distress.  Patient appears well-groomed.   Head:  Normocephalic/atraumatic Eyes:  Fundi examined but not visualized Neck: supple, no paraspinal tenderness, full range of motion Heart:  Regular rate and rhythm Lungs:  Clear to auscultation bilaterally Back: No paraspinal tenderness Neurological Exam: alert and oriented to person, place, and time. Attention span and concentration intact, recent and remote memory intact, fund of knowledge intact.  Speech fluent and not dysarthric, language intact.  CN II-XII intact. Bulk and tone normal, muscle strength 5/5 throughout.  Sensation to light touch, temperature and vibration intact.  Deep tendon reflexes 2+ throughout, toes downgoing.  Finger to nose and heel to shin testing intact.  Gait normal, Romberg negative.  IMPRESSION: 1.  Vestibular migraines/migrainous vertigo.  Unfortunately increased and persistent as she is weaning off of medications (amitriptyline, venlafaxine, topiramate).  2.  Migraine without aura, without status migrainosus, not intractable 3.  Ocular migraine 4.  Episodic daytime somnolence.  Currently scheduled to undergo PSG/MSLT  PLAN: 1.  She will remain on Aimovig 2.  Currently weaning off of amitriptyline, venlafaxine and topiramate.  Once she has had the sleep study performed, plan would be to restart topiramate and titrate up to 75mg  twice daily (requests script for both 50mg  and 25mg  tablet as she has difficulty cutting the pill). 3.  In the meantime, she plans to apply for short-term disability due to the persistent dizziness while off of her medications.  She will send forms to me. 4.  Follow up in 4 months.  , DO  CC: , MD

## 2020-05-05 ENCOUNTER — Other Ambulatory Visit: Payer: Self-pay

## 2020-05-05 ENCOUNTER — Encounter: Payer: Self-pay | Admitting: Neurology

## 2020-05-05 ENCOUNTER — Ambulatory Visit: Payer: BC Managed Care – PPO | Admitting: Neurology

## 2020-05-05 VITALS — BP 132/90 | HR 94 | Resp 16 | Ht 60.0 in | Wt 198.4 lb

## 2020-05-05 DIAGNOSIS — G43009 Migraine without aura, not intractable, without status migrainosus: Secondary | ICD-10-CM | POA: Diagnosis not present

## 2020-05-05 DIAGNOSIS — G43109 Migraine with aura, not intractable, without status migrainosus: Secondary | ICD-10-CM

## 2020-05-05 DIAGNOSIS — G4719 Other hypersomnia: Secondary | ICD-10-CM | POA: Diagnosis not present

## 2020-05-05 NOTE — Patient Instructions (Signed)
1.  Continue schedule to get off of medications. 2.  Contact me after the test for instructions on restarting topiramate 3.  Continue Aimovig 4.  Follow up in 4 months.

## 2020-05-07 ENCOUNTER — Other Ambulatory Visit: Payer: Self-pay | Admitting: Neurology

## 2020-05-07 DIAGNOSIS — G4719 Other hypersomnia: Secondary | ICD-10-CM

## 2020-05-07 DIAGNOSIS — G4752 REM sleep behavior disorder: Secondary | ICD-10-CM

## 2020-05-07 DIAGNOSIS — G478 Other sleep disorders: Secondary | ICD-10-CM

## 2020-05-07 DIAGNOSIS — F418 Other specified anxiety disorders: Secondary | ICD-10-CM

## 2020-05-07 DIAGNOSIS — F419 Anxiety disorder, unspecified: Secondary | ICD-10-CM

## 2020-05-25 ENCOUNTER — Telehealth: Payer: Self-pay | Admitting: Neurology

## 2020-05-25 NOTE — Telephone Encounter (Signed)
Will ask Shena on Wednesday when she returns.

## 2020-05-25 NOTE — Telephone Encounter (Signed)
Patient called in to find out if we have received her short term disability paperwork that was faxed in.

## 2020-05-26 ENCOUNTER — Encounter: Payer: Self-pay | Admitting: Neurology

## 2020-05-27 ENCOUNTER — Other Ambulatory Visit: Payer: Self-pay | Admitting: Neurology

## 2020-05-27 DIAGNOSIS — G43709 Chronic migraine without aura, not intractable, without status migrainosus: Secondary | ICD-10-CM

## 2020-06-08 ENCOUNTER — Ambulatory Visit (INDEPENDENT_AMBULATORY_CARE_PROVIDER_SITE_OTHER): Payer: BC Managed Care – PPO | Admitting: Neurology

## 2020-06-08 DIAGNOSIS — G4710 Hypersomnia, unspecified: Secondary | ICD-10-CM

## 2020-06-08 DIAGNOSIS — G4752 REM sleep behavior disorder: Secondary | ICD-10-CM

## 2020-06-08 DIAGNOSIS — F518 Other sleep disorders not due to a substance or known physiological condition: Secondary | ICD-10-CM

## 2020-06-08 DIAGNOSIS — G4719 Other hypersomnia: Secondary | ICD-10-CM

## 2020-06-08 DIAGNOSIS — R400 Somnolence: Secondary | ICD-10-CM

## 2020-06-11 ENCOUNTER — Other Ambulatory Visit: Payer: Self-pay | Admitting: Neurology

## 2020-06-22 ENCOUNTER — Encounter: Payer: Self-pay | Admitting: Neurology

## 2020-06-26 DIAGNOSIS — R400 Somnolence: Secondary | ICD-10-CM | POA: Insufficient documentation

## 2020-06-26 NOTE — Procedures (Signed)
PATIENT'S NAME:  Vanessa Huff, Vanessa Huff DOB:      20-Dec-1975      MR#:    [id number]     DATE OF RECORDING: 06/08/2020 REFERRING M.D.:  Olena Mater MD Study Performed:   Baseline Polysomnogram HISTORY:  Mrs. Mehlberg presents with a very active dreams, these are vivid and happen repeatedly in the one night, they cluster around the morning hours and are most likely REM sleep related. She is usually fighting, kicking ,sometimes even biting, but as much verbally active. At least once she has almost fallen out of bed but she is usually not sleepwalking. Further interest is that she has the irresistible urge to go to sleep and about 2 years ago was found not to have apnea which is the most common reason for hypersomnia. Her Epworth sleepiness score is endorsed at a level that would endorse narcolepsy. She has never been evaluated for narcolepsy before but she states that even when she takes just 5 minutes of sleep she will dream this sounds very much like a hypnagogic hypnopompic hallucination, she has not noted cataplexy. But dream intrusion, and she has experienced sleep paralysis.   My Plan is to proceed with: 1) PSG and MSLT at a later point. We can't do MSLT right now (03-26-2020) due to medication - REM suppressant medications have all to be weaned off and she needs to be off for 3 weeks.  2) HLA narcolepsy panel    The patient endorsed the Epworth Sleepiness Scale at 22 points.   The patient's weight 203 pounds with a height of 60 (inches), resulting in a BMI of 39.8 kg/m2. The patient's neck circumference measured 16.5 inches.  CURRENT MEDICATIONS: Aimovig, Elavil, Buspar, Valium, Introvale, Metformin, Provigil, Phenergan, Zocor, Topamax, Effexor   PROCEDURE:  This is a multichannel digital polysomnogram utilizing the Somnostar 11.2 system.  Electrodes and sensors were applied and monitored per AASM Specifications.   EEG, EOG, Chin and Limb EMG, were sampled at 200 Hz.  ECG, Snore and Nasal Pressure,  Thermal Airflow, Respiratory Effort, CPAP Flow and Pressure, Oximetry was sampled at 50 Hz. Digital video and audio were recorded.      BASELINE STUDY: Lights Out was at 20:57 and Lights On at 05:00.  Total recording time (TRT) was 483 minutes, with a total sleep time (TST) of 440.5 minutes.   The patient's sleep latency was 26.5 minutes.  REM latency was 22 minutes.  The sleep efficiency was 91.2 %.     SLEEP ARCHITECTURE: WASO (Wake after sleep onset) was 19 minutes.  There were 24 minutes in Stage N1, 151 minutes Stage N2, 115 minutes Stage N3 and 150.5 minutes in Stage REM.  The percentage of Stage N1 was 5.4%, Stage N2 was 34.3%, Stage N3 was 26.1% and Stage R (REM sleep) was 34.2%.   RESPIRATORY ANALYSIS:  There were a total of 13 respiratory events:  0 obstructive apneas, 0 central apneas and 0 mixed apneas with a total of 0 apneas and an apnea index (AI) of 0 /hour. There were 13 hypopneas with a hypopnea index of 1.8 /hour.      The total APNEA/HYPOPNEA INDEX (AHI) was 1.8/hour.  10 events occurred in REM sleep and 6 events in NREM. The REM AHI was  4.0 /hour, versus a non-REM AHI of .6. The patient spent 178.5 minutes of total sleep time in the supine position and 262 minutes in non-supine. The supine AHI was 3.4 versus a non-supine AHI of 0.7/h.  OXYGEN SATURATION &  C02:  The Wake baseline 02 saturation was 97%, with the lowest being 85%. Time spent below 89% saturation equaled 0 minutes.  The arousals were noted as: 56 were spontaneous, 14 were associated with PLMs, 3 were associated with respiratory events. The PLM Arousal index was 1.9/hour. Some non- periodic and periodic limb movements in REM sleep.   Audio and video analysis did show movements, phonations or vocalizations in REM sleep.  EKG was in keeping with normal sinus rhythm (NSR).  IMPRESSION:  1. NO evidence of Obstructive Sleep Apnea (OSA). 2. Mild Periodic Limb Movement Disorder (PLMD). 3. REM sleep activity with full  body movements and groaning and vocalizations.   4. Increased REM sleep proportionally to NREM sleep and shorter than expected REM latency.    RECOMMENDATIONS:  MSLT to follow   I certify that I have reviewed the entire raw data recording prior to the issuance of this report in accordance with the Standards of Accreditation of the American Academy of Sleep Medicine (AASM)    Larey Seat, MD Diplomat, American Board of Psychiatry and Neurology  Diplomat, American Board of Sleep Medicine Market researcher, [facility] [employer]

## 2020-06-26 NOTE — Progress Notes (Signed)
IMPRESSION:   1. NO evidence of Obstructive Sleep Apnea (OSA).  2. Mild Periodic Limb Movement Disorder (PLMD).  3. REM sleep activity with full body movements and groaning and  vocalizations.   4. Increased REM sleep proportionally to NREM sleep and shorter  than expected REM latency.    RECOMMENDATIONS:   MSLT to follow .

## 2020-06-29 ENCOUNTER — Encounter: Payer: Self-pay | Admitting: Neurology

## 2020-07-06 ENCOUNTER — Telehealth: Payer: Self-pay | Admitting: Neurology

## 2020-07-06 NOTE — Telephone Encounter (Signed)
Tried to call pt, Phone line busy. Forms filled out and at the front desk.

## 2020-07-06 NOTE — Telephone Encounter (Signed)
Form completed.

## 2020-07-06 NOTE — Telephone Encounter (Signed)
Patient called to check on the status of the forms she faxed to the office. Let her know per [facility] last message they hadn't been filled out yet. Patient states she is due back to work Wednesday and needs the forms by then.

## 2020-10-05 NOTE — Progress Notes (Signed)
Virtual Visit via Video Note The purpose of this virtual visit is to provide medical care while limiting exposure to the novel coronavirus.    Consent was obtained for video visit:  Yes.   Answered questions that patient had about telehealth interaction:  Yes.   I discussed the limitations, risks, security and privacy concerns of performing an evaluation and management service by telemedicine. I also discussed with the patient that there may be a patient responsible charge related to this service. The patient expressed understanding and agreed to proceed.  Pt location: Home Physician Location: office Name of referring provider:  Christena Flake, MD I connected with Vanessa Huff at patients initiation/request on 10/07/2020 at 10:30 AM EST by video enabled telemedicine application and verified that I am speaking with the correct person using two identifiers. Pt MRN:  [id number] Pt DOB:  [birth year] Video Participants:  Vanessa Huff   History of Present Illness:  Vanessa Huff is a 44 year old woman with depression, generalized anxiety disorder, type 2 diabetes and vestibular migraine who follows up for migraine.  UPDATE: Intensity:  Moderate Duration:  Couple of hours Frequency:  In November, 3 days during last week. No vertigo spells. Rescue protocol for common migraine: Cafergot; phenrgan for nausea Rescue protocol for vertigo: Diazepam and Dramamine Frequency of abortive medication:Cafergot every 2 to 3 weeks Current NSAIDS:Ibuprofen (rarely, usually just deals with the headache) Current analgesics:none Current triptans:none Current ergotamine:Cafergot (has not needed it) Current anti-emetic:Phenergan 25mg  Current muscle relaxants:none Current anti-anxiolytic:Diazepam 2 mg every 4 hours as needed for acute vertigo, buspirone 10mg  2-3 tablets daily Current sleep aide:none Current Antihypertensive medications:none Current Antidepressant  medications:Lexparo Current Anticonvulsant medications:topirmate75mg  twice daily (helps with vertigo) Current anti-CGRP:Aimovig 140 mg Current Vitamins/Herbal/Supplements:D2 Current Antihistamines/Decongestants:none Other therapy:None Hormone/birth control: INTROVA-LE Other medications:  none  Caffeine:No coffee or soda Diet:Only water Exercise:Karate, kickboxing Depression:stable; Anxiety:yes Other pain:no Sleep hygiene:Good with amitriptyline. She "acts out" in her dreams. She had a sleep study but she wasn't symptomatic that night. She woke up multiple times.She was referred to sleep medicine at [facility].  Plan is to evaluate for narcolepsy.  Genetic testing negative.  However, she would need to taper off of amitriptyline for PSG/MSLT.  Weaning off of topiramate, amitriptyline and venlafaxine.  Sleep study was normal.  When she weaned off of venlafaxine, her narcolepsy symptoms resolved.  However, her psychiatrist took her off of amitriptyline due to concern for serotonin syndrome.  HISTORY: Onset: Since adolescence Location:right temple Quality:Usually non-throbbing ache, sometimes stabbing Initial intensity:Usually mild-moderate, then sometimes severe.Shedenies new headache, thunderclap headache  Aura:no Prodrome:no Postdrome:no Associated symptoms: Nausea, vertigo, photophobia, phonophobia.Shedenies associated visual disturbance orunilateral numbness or weakness. Initial duration:6-8 hours InitialFrequency:Severe ones occur every 2 to 3 months. Otherwise a daily headache InitialFrequency of abortive medication:rarely Triggers: Emotional stress, laying down Relieving factors: Nothing Activity:Can't function for severe ones She also has vertigo without headache, lasting 1 day (occasinally a week). Occurs almost everyday. Meclizine ineffective. She also reports episodes consistent with ocular migraines whereshe sees  flashing lights or squiggly lines that occur without headache. They are brief, a few seconds and only has occurred about once a month.  Past NSAIDS:Aleve Past analgesics:Excedrin Migraine, Tylenol, Fioricet Past abortive triptans:Sumatriptan tablet (ineffective) Past abortive ergotamine:none Past muscle relaxants:none Past anti-emetic:none Past antihypertensive medications:none Past antidepressant medicatimions:Venlafaxine XR, amitriptyline 50mg  Past anticonvulsant medications:topiramate 100mg  (ineffective) Past anti-CGRP:none Past vitamins/Herbal/Supplements:none Past antihistamines/decongestants:none Other pastheadache cocktails: Decadron/toradol/benadryl/reglan, Zofran and morphine (both ineffective) Other past treatment: Meclizine (ineffective)  Family history of headache:Son, mom  06/07/2019 MRI  BRAIN W WO: There is no evidence of acute infarct, intracranial hemorrhage, mass, midline shift, or extra-axial fluid collection. The ventricles and sulci are normal. A few punctate foci of T2 FLAIR hyperintensity in the cerebral white matter are nonspecific and not considered abnormal for age. No abnormal enhancement is identified. Vascular: Major intracranial vascular flow voids are preserved.   Past Medical History: Past Medical History:  Diagnosis Date  . Chronic headaches   . Depression     Medications: Outpatient Encounter Medications as of 10/07/2020  Medication Sig Note  . AIMOVIG 140 MG/ML SOAJ ADMINISTER 1 ML UNDER THE SKIN EVERY 30 DAYS   . amitriptyline (ELAVIL) 25 MG tablet Take 2 tablets at bedtime for a week, then 1 tablet at bedtime for a week, then STOP   . busPIRone (BUSPAR) 10 MG tablet Take by mouth. 3 in the AM, 2-3 additional PRN   . diazepam (VALIUM) 2 MG tablet Take 1 tablet (2 mg total) by mouth every 4 (four) hours as needed.   . ergotamine-caffeine (CAFERGOT) 1-100 MG tablet 1 to 2 tablets at onset of migraine.  May repeat  every 1h up to maximum of 4 tablets in 24h   . levonorgestrel-ethinyl estradiol (INTROVALE) 0.15-0.03 MG tablet Take 1 tablet by mouth daily.   . metFORMIN (GLUCOPHAGE) 850 MG tablet Take 850 mg by mouth 2 (two) times daily with a meal.    . modafinil (PROVIGIL) 200 MG tablet Take 1 tablet (200 mg total) by mouth daily.   . promethazine (PHENERGAN) 25 MG tablet Take 25 mg by mouth every 6 (six) hours as needed for nausea or vomiting.   . simvastatin (ZOCOR) 10 MG tablet Take by mouth.   . topiramate (TOPAMAX) 25 MG tablet TAKE 1 TABLET BY MOUTH AT BEDTIME FOR 1 WEEK THEN TAKE 1 TABLET BY MOUTH TWICE DAILY   . topiramate (TOPAMAX) 50 MG tablet TAKE 1 TABLET(50 MG) BY MOUTH TWICE DAILY   . venlafaxine (EFFEXOR) 75 MG tablet Take 75 mg by mouth daily.   Marland Kitchen venlafaxine XR (EFFEXOR-XR) 150 MG 24 hr capsule Take 150 mg by mouth daily with breakfast. 05/05/2020: Taking as ordered  . Vitamin D, Ergocalciferol, 50 MCG (2000 UT) CAPS Take 2,000 Units by mouth daily.     No facility-administered encounter medications on file as of 10/07/2020.    Allergies: Allergies  Allergen Reactions  . Lortab [Hydrocodone-Acetaminophen]     nightmares  . Other     DHE - increased blood pressure    Family History: Family History  Problem Relation Age of Onset  . Breast cancer Mother   . Lung cancer Father     Social History: Social History   Socioeconomic History  . Marital status: Married    Spouse name: Baldo Ash  . Number of children: Not on file  . Years of education: Not on file  . Highest education level: 12th grade  Occupational History  . Occupation: Conservation officer, nature: [employer]  Tobacco Use  . Smoking status: Never Smoker  . Smokeless tobacco: Never Used  Vaping Use  . Vaping Use: Never used  Substance and Sexual Activity  . Alcohol use: Never  . Drug use: Never  . Sexual activity: Not on file  Other Topics Concern  . Not on file  Social History Narrative   Patient is  right-handed. She lives with her husband in a one story home. She avoids all caffeine. She participates in karate and other MMA sports.  Social Determinants of Health   Financial Resource Strain:   . Difficulty of Paying Living Expenses: Not on file  Food Insecurity:   . Worried About Programme researcher, broadcasting/film/video in the Last Year: Not on file  . Ran Out of Food in the Last Year: Not on file  Transportation Needs:   . Lack of Transportation (Medical): Not on file  . Lack of Transportation (Non-Medical): Not on file  Physical Activity:   . Days of Exercise per Week: Not on file  . Minutes of Exercise per Session: Not on file  Stress:   . Feeling of Stress : Not on file  Social Connections:   . Frequency of Communication with Friends and Family: Not on file  . Frequency of Social Gatherings with Friends and Family: Not on file  . Attends Religious Services: Not on file  . Active Member of Clubs or Organizations: Not on file  . Attends Banker Meetings: Not on file  . Marital Status: Not on file  Intimate Partner Violence:   . Fear of Current or Ex-Partner: Not on file  . Emotionally Abused: Not on file  . Physically Abused: Not on file  . Sexually Abused: Not on file    Observations/Objective:   Height 5\' 1"  (1.549 m), weight 185 lb (83.9 kg). No acute distress.  Alert and oriented.  Speech fluent and not dysarthric.  Language intact.  Eyes orthophoric on primary gaze.  Face symmetric.  Assessment and Plan:   Migraine without aura Migrainous vertigo  1.  Migraine prevention:  Aimovig 140mg , topiramate 75mg  twice daily (for vertigo prevention) 2.  Migraine rescue:  Cafergot 3.  Acute vertigo rescue: diazepam, promethazine 4.  Limit use of pain relievers to no more than 2 days out of week to prevent risk of rebound or medication-overuse headache. 5.  Keep headache diary 6.  Follow up 6 months.  Follow Up Instructions:    -I discussed the assessment and treatment plan  with the patient. The patient was provided an opportunity to ask questions and all were answered. The patient agreed with the plan and demonstrated an understanding of the instructions.   The patient was advised to call back or seek an in-person evaluation if the symptoms worsen or if the condition fails to improve as anticipated.  , DO

## 2020-10-07 ENCOUNTER — Telehealth (INDEPENDENT_AMBULATORY_CARE_PROVIDER_SITE_OTHER): Payer: BC Managed Care – PPO | Admitting: Neurology

## 2020-10-07 ENCOUNTER — Other Ambulatory Visit: Payer: Self-pay

## 2020-10-07 ENCOUNTER — Encounter: Payer: Self-pay | Admitting: Neurology

## 2020-10-07 VITALS — Ht 61.0 in | Wt 185.0 lb

## 2020-10-07 DIAGNOSIS — G43109 Migraine with aura, not intractable, without status migrainosus: Secondary | ICD-10-CM

## 2020-10-07 DIAGNOSIS — G43009 Migraine without aura, not intractable, without status migrainosus: Secondary | ICD-10-CM

## 2020-12-10 ENCOUNTER — Other Ambulatory Visit: Payer: Self-pay | Admitting: Neurology

## 2020-12-10 DIAGNOSIS — G43709 Chronic migraine without aura, not intractable, without status migrainosus: Secondary | ICD-10-CM

## 2020-12-18 ENCOUNTER — Other Ambulatory Visit: Payer: Self-pay | Admitting: Neurology

## 2020-12-18 NOTE — Telephone Encounter (Signed)
Patient called in wanting a prescription sent in for promethazine.

## 2020-12-22 MED ORDER — PROMETHAZINE HCL 25 MG PO TABS: 25.0000 mg | ORAL_TABLET | Freq: Four times a day (QID) | ORAL | 5 refills | Status: DC | PRN

## 2021-01-13 ENCOUNTER — Telehealth: Payer: Self-pay | Admitting: Neurology

## 2021-01-13 ENCOUNTER — Other Ambulatory Visit: Payer: Self-pay

## 2021-02-06 ENCOUNTER — Other Ambulatory Visit: Payer: Self-pay | Admitting: Neurology

## 2021-02-12 ENCOUNTER — Other Ambulatory Visit: Payer: Self-pay | Admitting: Neurology

## 2021-04-06 NOTE — Progress Notes (Signed)
NEUROLOGY FOLLOW UP OFFICE NOTE  Vanessa Huff [id number]  Assessment/Plan:   1.  Migraine without aura 2.  Migrainous vertigo  1.   Migraine prevention:  Aimovig 140mg , topiramate 75mg  BID (for vertigo prevention) 2.  Migraine rescue:  Cafergot and Phenergan 3.  Limit use of pain relievers to no more than 2 days out of week to prevent risk of rebound or medication-overuse headache. 4.  Keep headache diary 5.  Follow up 9 months  Subjective:  Vanessa Huff is a 45 year old woman with depression, generalized anxiety disorder, type 2 diabetes and vestibular migraine whofollows up for migraine.  UPDATE: Intensity:  Moderate Duration:  Couple of hours Frequency:  no migraines in May. A few vertigo spells if she lays down at night and will last until she falls asleep.  They usually occur once a week. 47 Rescue protocol for common migraine: Cafergot; phenrgan for nausea Frequency of abortive medication:Cafergot every 2 to 3 weeks Current NSAIDS:Ibuprofen (rarely, usually just deals with the headache) Current analgesics:none Current triptans:none Current ergotamine:Cafergot (has not needed it) Current anti-emetic:Phenergan 25mg  Current muscle relaxants:none Current anti-anxiolytic:buspirone 10mg  2-3 tablets daily Current sleep aide:none Current Antihypertensive medications:none Current Antidepressant medications:Lexparo, Wellbutrin, amitriptyline 75mg  at bedtime (helps her sleep) Current Anticonvulsant medications:topirmate75mg  twice daily (helps with vertigo) Current anti-CGRP:Aimovig 140 mg Current Vitamins/Herbal/Supplements:D2 Current Antihistamines/Decongestants:none Other therapy:None Hormone/birth control:seasonale Other medications: BuSpar  Dream enactment behavior resolved after discontinuing Effexor.  She started Wellbutrin in January and the symptoms returned.    Caffeine:No coffee or soda Diet:Only  water Exercise:Karate, kickboxing Depression:stable; Anxiety:yes Other pain:no Sleep hygiene:Good with amitriptyline. She "acts out" in her dreams. She had a sleep study but she wasn't symptomatic that night. She woke up multiple times.She was referred to [facility] medicine at [facility]. Plan is to evaluate for narcolepsy. Genetic testing negative.However, she would need to taper off of amitriptyline for PSG/MSLT.Weaning off oftopiramate,amitriptyline and venlafaxine. Sleep study was normal.  When she weaned off of venlafaxine, her narcolepsy symptoms resolved.  However, her psychiatrist took her off of amitriptyline due to concern for serotonin syndrome.  HISTORY: Onset: Since adolescence Location:right temple Quality:Usually non-throbbing ache, sometimes stabbing Initial intensity:Usually mild-moderate, then sometimes severe.Shedenies new headache, thunderclap headache  Aura:no Prodrome:no Postdrome:no Associated symptoms: Nausea, vertigo, photophobia, phonophobia.Shedenies associated visual disturbance orunilateral numbness or weakness. Initial duration:6-8 hours InitialFrequency:Severe ones occur every 2 to 3 months. Otherwise a daily headache InitialFrequency of abortive medication:rarely Triggers: Emotional stress, laying down Relieving factors: Nothing Activity:Can't function for severe ones She also has vertigo without headache, lasting 1 day (occasinally a week). Occurs almost everyday. Meclizine ineffective. She also reports episodes consistent with ocular migraines whereshe sees flashing lights or squiggly lines that occur without headache. They are brief, a few seconds and only has occurred about once a month.  Past NSAIDS:Aleve Past analgesics:Excedrin Migraine, Tylenol, Fioricet Past abortive triptans:Sumatriptan tablet (ineffective) Past abortive ergotamine:none Past muscle relaxants:none Past  anti-emetic:none Past antihypertensive medications:none Past antidepressant medicatimions:Venlafaxine XR Past anticonvulsant medications:topiramate 100mg  (ineffective) Past anti-CGRP:none Past vitamins/Herbal/Supplements:none Past antihistamines/decongestants:none Other pastheadache cocktails: Decadron/toradol/benadryl/reglan, Zofran and morphine (both ineffective) Other past treatment: Meclizine (ineffective), diazepam  Family history of headache:Son, mom  06/07/2019 MRI BRAIN W WO: There is no evidence of acute infarct, intracranial hemorrhage, mass, midline shift, or extra-axial fluid collection. The ventricles and sulci are normal. A few punctate foci of T2 FLAIR hyperintensity in the cerebral white matter are nonspecific and not considered abnormal for age. No abnormal enhancement is identified. Vascular: Major intracranial vascular flow voids are preserved.  PAST MEDICAL HISTORY: Past Medical History:  Diagnosis Date  . Chronic headaches   . Depression     MEDICATIONS: Current Outpatient Medications on File Prior to Visit  Medication Sig Dispense Refill  . AIMOVIG 140 MG/ML SOAJ ADMINISTER 1 ML UNDER THE SKIN EVERY 30 DAYS 1 mL 5  . amitriptyline (ELAVIL) 25 MG tablet Take 2 tablets at bedtime for a week, then 1 tablet at bedtime for a week, then STOP (Patient not taking: Reported on 10/07/2020) 30 tablet 21  . busPIRone (BUSPAR) 10 MG tablet Take by mouth. 3 in the AM, 2-3 additional PRN    . diazepam (VALIUM) 2 MG tablet Take 1 tablet (2 mg total) by mouth every 4 (four) hours as needed. (Patient not taking: Reported on 10/07/2020) 30 tablet 0  . ergotamine-caffeine (CAFERGOT) 1-100 MG tablet 1 to 2 tablets at onset of migraine.  May repeat every 1h up to maximum of 4 tablets in 24h 30 tablet 3  . levonorgestrel-ethinyl estradiol (INTROVALE) 0.15-0.03 MG tablet Take 1 tablet by mouth daily.    . metFORMIN (GLUCOPHAGE) 850 MG tablet Take 850 mg by mouth 2  (two) times daily with a meal.     . modafinil (PROVIGIL) 200 MG tablet Take 1 tablet (200 mg total) by mouth daily. (Patient not taking: Reported on 10/07/2020) 30 tablet 5  . promethazine (PHENERGAN) 25 MG tablet Take 1 tablet (25 mg total) by mouth every 6 (six) hours as needed for nausea or vomiting. 30 tablet 5  . simvastatin (ZOCOR) 10 MG tablet Take by mouth.    . topiramate (TOPAMAX) 25 MG tablet TAKE 3 TABLETS BY MOUTH TWICE DAILY 180 tablet 1  . topiramate (TOPAMAX) 50 MG tablet TAKE 1 TABLET(50 MG) BY MOUTH TWICE DAILY 180 tablet 1  . venlafaxine (EFFEXOR) 75 MG tablet Take 75 mg by mouth daily.    Marland Kitchen venlafaxine XR (EFFEXOR-XR) 150 MG 24 hr capsule Take 150 mg by mouth daily with breakfast. (Patient not taking: Reported on 10/07/2020)    . Vitamin D, Ergocalciferol, 50 MCG (2000 UT) CAPS Take 2,000 Units by mouth daily.  (Patient not taking: Reported on 10/07/2020)     No current facility-administered medications on file prior to visit.    ALLERGIES: Allergies  Allergen Reactions  . Lortab [Hydrocodone-Acetaminophen]     Migarines  . Other     DHE - increased blood pressure    FAMILY HISTORY: Family History  Problem Relation Age of Onset  . Breast cancer Mother   . Lung cancer Father       Objective:  Blood pressure (!) 148/94, pulse (!) 105, height 5\' 1"  (1.549 m), weight 206 lb (93.4 kg), SpO2 97 %. General: No acute distress.  Patient appears well-groomed.   Head:  Normocephalic/atraumatic Eyes:  Fundi examined but not visualized Neck: supple, no paraspinal tenderness, full range of motion Heart:  Regular rate and rhythm Lungs:  Clear to auscultation bilaterally Back: No paraspinal tenderness Neurological Exam: alert and oriented to person, place, and time.  Speech fluent and not dysarthric, language intact.  CN II-XII intact. Bulk and tone normal, muscle strength 5/5 throughout.  Sensation to light touch intact.  Deep tendon reflexes 2+ throughout.  Finger to nose  testing intact.  Gait normal, Romberg negative.   , DO  CC: Shon Millet, MD

## 2021-04-07 ENCOUNTER — Ambulatory Visit: Payer: BC Managed Care – PPO | Admitting: Neurology

## 2021-04-07 ENCOUNTER — Other Ambulatory Visit: Payer: Self-pay

## 2021-04-07 ENCOUNTER — Encounter: Payer: Self-pay | Admitting: Neurology

## 2021-04-07 VITALS — BP 148/94 | HR 105 | Ht 61.0 in | Wt 206.0 lb

## 2021-04-07 DIAGNOSIS — G4752 REM sleep behavior disorder: Secondary | ICD-10-CM | POA: Diagnosis not present

## 2021-04-07 DIAGNOSIS — G43009 Migraine without aura, not intractable, without status migrainosus: Secondary | ICD-10-CM

## 2021-04-07 DIAGNOSIS — G43109 Migraine with aura, not intractable, without status migrainosus: Secondary | ICD-10-CM | POA: Diagnosis not present

## 2021-04-07 MED ORDER — TOPIRAMATE 25 MG PO TABS: 25.0000 mg | ORAL_TABLET | Freq: Two times a day (BID) | ORAL | 1 refills | Status: DC

## 2021-04-07 MED ORDER — TOPIRAMATE 50 MG PO TABS: 50.0000 mg | ORAL_TABLET | Freq: Two times a day (BID) | ORAL | 1 refills | Status: DC

## 2021-04-07 NOTE — Patient Instructions (Signed)
1.  CONTINUE TOPIRAMATE 75MG  TWICE DAILY (TAKE 1 25MG  TABLET AND 1 50MG  TABLET TWICE DAILY) as well as AIMOVIG 140mg  EVERY 4 WEEKS 2.  CAFERGOT AS NEEDED.  Limit use of pain relievers to no more than 2 days out of week to prevent risk of rebound or medication-overuse headache. 3.  Limit use of pain relievers to no more than 2 days out of week to prevent risk of rebound or medication-overuse headache. 4.  Keep headache diary 5.  Follow up 9 months

## 2021-04-09 ENCOUNTER — Telehealth: Payer: Self-pay

## 2021-04-09 ENCOUNTER — Other Ambulatory Visit: Payer: Self-pay | Admitting: Neurology

## 2021-04-09 MED ORDER — TOPIRAMATE 50 MG PO TABS: 50.0000 mg | ORAL_TABLET | Freq: Two times a day (BID) | ORAL | 1 refills | Status: DC

## 2021-04-09 NOTE — Telephone Encounter (Signed)
Per pt 50 mg Topamax never made it to the pharmacy.  New refill sent while the pt was on the phone.Script given verbal as well.

## 2021-05-12 ENCOUNTER — Telehealth: Payer: Self-pay

## 2021-05-12 ENCOUNTER — Other Ambulatory Visit: Payer: Self-pay | Admitting: Neurology

## 2021-05-12 MED ORDER — EMGALITY 120 MG/ML ~~LOC~~ SOAJ: 240.0000 mg | Freq: Once | SUBCUTANEOUS | 0 refills | Status: AC

## 2021-05-12 MED ORDER — EMGALITY 120 MG/ML ~~LOC~~ SOAJ: 120.0000 mg | SUBCUTANEOUS | 5 refills | Status: DC

## 2021-05-12 NOTE — Telephone Encounter (Signed)
Aimovig isn't covered under her plan, please change Rx and send in pharmacy is correct.

## 2021-05-14 NOTE — Telephone Encounter (Signed)
Left message for patient to contact pharmacy  

## 2021-05-18 NOTE — Telephone Encounter (Signed)
Close encounter 

## 2021-05-26 NOTE — Progress Notes (Unsigned)
Emgality 120mg  pending reference sent to clinical review for authorization. Patient id [id number]. Phone number to insurance [phone]

## 2021-06-01 NOTE — Progress Notes (Unsigned)
ROSLAND RIDING (Key: [id number]) Aimovig 140MG /ML auto-injectors   Form [facility] Electronic Prior Authorization Form (2017 NCPDP) Created 1 year ago Sent to Plan 1 year ago Plan Response 1 year ago Submit Clinical Questions 1 year ago Determination Favorable 1 year ago Message from Plan Request Reference Number: [id number]. AIMOVIG INJ 140MG /ML is approved through 05/04/2021. Your patient may now fill this prescription and it will be covered. Sent to scan.

## 2021-06-03 ENCOUNTER — Telehealth: Payer: Self-pay | Admitting: Neurology

## 2021-06-03 ENCOUNTER — Other Ambulatory Visit: Payer: Self-pay | Admitting: Neurology

## 2021-06-03 MED ORDER — AJOVY 225 MG/1.5ML ~~LOC~~ SOAJ: 225.0000 mg | SUBCUTANEOUS | 5 refills | Status: DC

## 2021-06-03 NOTE — Telephone Encounter (Signed)
Pt advise of script sent to the pharmacy.

## 2021-06-03 NOTE — Telephone Encounter (Signed)
Pt called regarding her PA for emgality. Stated she got a denial letter 05/29/21. Stated she needs to try ajovy first. She said she will try it, if that's what it takes to get her PA covered. She has had HA, and doesn't want them to start back strong. Please call her [phone]

## 2021-06-03 NOTE — Progress Notes (Unsigned)
aj

## 2021-06-09 ENCOUNTER — Telehealth: Payer: Self-pay | Admitting: Neurology

## 2021-06-09 NOTE — Telephone Encounter (Signed)
Pt is calling about a PA for ajovy. Stated her insurance denied emgality, said to try ajovy, now its asking for a PA. Please let  her know if she needs to do anything [phone]

## 2021-06-10 NOTE — Progress Notes (Signed)
Menna Youngren Key: [id number] help? Call us at [phone] Status Sent to [facility] AJOVY (fremanezumab-vfrm) injection 225MG /1.5ML auto-injectors Form [facility] Electronic Prior Authorization Form (2017 NCPDP)

## 2021-06-10 NOTE — Telephone Encounter (Signed)
Sent to prior authorization folder  

## 2021-06-18 NOTE — Telephone Encounter (Signed)
Pt called in wanting to get an update on the prior authorization for her Ajovy

## 2021-06-21 NOTE — Telephone Encounter (Signed)
Pt just called her insurance and they stated we didn't get the PA paper work there in time and a new one needs to be sent over. The med is ajovy.

## 2021-06-21 NOTE — Telephone Encounter (Signed)
Pt is calling to follow up on her PA for medicine.

## 2021-06-23 NOTE — Telephone Encounter (Signed)
LMOVM, PA started for Ajovy 06/10/21, If we needed to send extra information, We aplogies last week the staff was short.  Will restart this Friday. If she needs a sample we do have them here at the office.  I will be out of the office for th rest of the day. Back tomorrow if the pt has any questions.

## 2021-06-24 ENCOUNTER — Telehealth: Payer: Self-pay | Admitting: Neurology

## 2021-06-24 NOTE — Telephone Encounter (Signed)
Pt called in stating she has a migraine that nothing is helping. She had also called in while we were at lunch and left message with Access Nurse below. The pt would like to know if there is something she can do? Also wanted to find out about the prior auth for Caffergot injection?

## 2021-06-25 MED ORDER — PREDNISONE 10 MG (21) PO TBPK: ORAL_TABLET | ORAL | 0 refills | Status: DC

## 2021-06-25 NOTE — Telephone Encounter (Signed)
Prednisone taper 10 mg sent to the pharmacy per Dr.Jaffe.

## 2021-06-25 NOTE — Telephone Encounter (Signed)
Advised pt that we will have the new PA Coordinator start a new PA for her for ajovy.   Marlowe Kays If you could keep the pt posted on the progress of the new PA.  Contact pt at the [phone].   Dr.Jaffe the pt headache has claimed down some today but she wanted to know if she could have a Prednisone taper called into the pharmacy just in case. She stays an hour away and can not make her for Headache cocktails.

## 2021-06-30 ENCOUNTER — Telehealth: Payer: Self-pay

## 2021-06-30 NOTE — Telephone Encounter (Signed)
New message   Pending   Key: [facility] - Rx #: [id number] Need help? Call us at [phone] Status Sent to [facility] AJOVY (fremanezumab-vfrm) injection 225MG /1.5ML auto-injectors Form [employer] Electronic Prior Authorization Form (2017 NCPDP) Original Claim Info 38

## 2021-07-01 NOTE — Telephone Encounter (Signed)
F/u  Approved today  Julieanne Cotton Key: [facility] - PA Case ID: [id number] - Rx #: [id number]  Request Reference Number: [id number]. AJOVY INJ 225/1.5 is approved through 01/01/2022.   Your patient may now fill this prescription and it will be covered.  Drug AJOVY (fremanezumab-vfrm) injection 225MG /1.5ML auto-injectors  Form [employer] Electronic Prior Authorization Form (2017 NCPDP)

## 2021-07-08 NOTE — Telephone Encounter (Signed)
F/U   Outcome Approved on August 25  Request Reference Number: [id number]. AJOVY INJ 225/1.5 is approved through 01/01/2022. Your patient may now fill this prescription and it will be covered.

## 2021-09-04 ENCOUNTER — Other Ambulatory Visit: Payer: Self-pay | Admitting: Neurology

## 2021-09-28 ENCOUNTER — Telehealth: Payer: Self-pay

## 2021-09-28 ENCOUNTER — Telehealth: Payer: Self-pay | Admitting: Neurology

## 2021-09-28 NOTE — Telephone Encounter (Signed)
New message - website CoverMyMeds  Your information has been sent to [facility].  Julieanne Cotton Key: Jeanella Cara - PA Case ID: [id number] Need help? Call us at [phone] Status Sent to [facility] AJOVY (fremanezumab-vfrm) injection 225MG /1.5ML auto-injectors Form [facility] Electronic Prior Authorization Form (2017 NCPDP)

## 2021-09-28 NOTE — Telephone Encounter (Signed)
F/u   Vanessa Huff Key: [id number] - PA Case ID: [id number] help? Call us at [phone] Outcome N/Atoday This medication or product was previously approved on [id number] from 07/01/2021 to 01/01/2022. You will be able to fill a prescription for this medication at your pharmacy. If your pharmacy has questions regarding the processing of your prescription, please have them call the [facility] pharmacy help desk at [phone]. Drug AJOVY (fremanezumab-vfrm) injection 225MG/1.5ML auto-injectors Form [facility] Electronic Prior Authorization Form (2017 NCPDP) 

## 2021-09-28 NOTE — Telephone Encounter (Signed)
Per pt her insurance required 90 days for preventive meds.

## 2021-09-28 NOTE — Telephone Encounter (Signed)
F/u   Julieanne Cotton Key: [place] - PA Case ID: [id number] Need help? Call us at [phone] Outcome N/Atoday This medication or product was previously approved on [id number] from 07/01/2021 to 01/01/2022. You will be able to fill a prescription for this medication at your pharmacy. If your pharmacy has questions regarding the processing of your prescription, please have them call the [facility] pharmacy help desk at [phone]. Drug AJOVY (fremanezumab-vfrm) injection 225MG /1.5ML auto-injectors Form [facility] Electronic Prior Authorization Form (2017 NCPDP)

## 2021-09-28 NOTE — Telephone Encounter (Signed)
Pt said she needs a new PA for her Ajovy, and it has to be for 90 days not 30

## 2021-11-22 ENCOUNTER — Other Ambulatory Visit: Payer: Self-pay | Admitting: Neurology

## 2021-12-12 ENCOUNTER — Other Ambulatory Visit: Payer: Self-pay | Admitting: Neurology

## 2022-01-04 NOTE — Progress Notes (Signed)
NEUROLOGY FOLLOW UP OFFICE NOTE  Vanessa Huff [id number]  Assessment/Plan:   1.  Migraine without aura 2.  Migrainous vertigo 3.  Tremor - may be medication-induced (with increased dose of Wellbutrin) vs essential tremor or anxiety   1.   Migraine prevention:  Ajovy monthly and topiramate 75mg  twice daily  2.  Migraine rescue:  Cafergot and Phenergan 3.  Advised to discuss reducing dose of Wellbutrin with prescribing provider.  If tremor does not improve, consider increasing topiramate to 100mg  twice daily.  Check TSH 4.  Limit use of pain relievers to no more than 2 days out of week to prevent risk of rebound or medication-overuse headache. 5.  Keep headache diary 6.  Follow up 9 months   Subjective:  Vanessa Huff is a 46 year old woman with depression, generalized anxiety disorder, type 2 diabetes and vestibular migraine who follows up for migraine.   UPDATE: Aimovig was no longer covered by her plan, so she was switched to Ajovy.    Intensity:  Moderate Duration:  Couple of hours Frequency:  1 migraine every 2 months.   Vertigo spells are occurring 1 to 2 times a month.  Again, usually while laying in bed trying to fall asleep.    Since end of December, she reports hand tremors.  Onset may have correlated with increasing dose of Wellbutrin but her father had hand tremors.  Causes difficulty giving shots.    Rescue protocol for common migraine:  Cafergot; phenrgan for nausea Frequency of abortive medication: Cafergot every 2 to 3 weeks Current NSAIDS:  Ibuprofen (rarely, usually just deals with the headache) Current analgesics:  none Current triptans:  none Current ergotamine:   Cafergot (has not needed it) Current anti-emetic:  Phenergan 25mg  Current muscle relaxants:  none Current anti-anxiolytic:   buspirone 10mg  2-3 tablets daily Current sleep aide:  none Current Antihypertensive medications:  none Current Antidepressant medications:  Lexparo, Wellbutrin,  amitriptyline 75mg  at bedtime (helps her sleep) Current Anticonvulsant medications:  topirmate 75mg  twice daily (helps with vertigo) Current anti-CGRP:   Ajovy Current Vitamins/Herbal/Supplements:  D2 Current Antihistamines/Decongestants:  none Other therapy: None Hormone/birth control: seasonale Other medications:  BuSpar   Caffeine:  No coffee or soda Diet:  Only water Exercise:  Karate, kickboxing Depression: stable; Anxiety:  yes Other pain:  no Sleep hygiene:  Good with amitriptyline.  She "acts out" in her dreams.  She had a sleep study but she wasn't symptomatic that night.  She woke up multiple times.  She was referred to sleep medicine at [facility].  Plan is to evaluate for narcolepsy.  Genetic testing negative.  However, she would need to taper off of amitriptyline for PSG/MSLT.  Weaning off of topiramate, amitriptyline and venlafaxine.  Sleep study was normal.  When she weaned off of venlafaxine, her narcolepsy symptoms resolved.  However, her psychiatrist took her off of amitriptyline due to concern for serotonin syndrome.   HISTORY: Onset: Since adolescence Location:  right temple Quality:  Usually non-throbbing ache, sometimes stabbing Initial intensity:  Usually mild-moderate, then sometimes severe.  She denies new headache, thunderclap headache  Aura:  no Prodrome:  no Postdrome:  no Associated symptoms: Nausea, vertigo, photophobia, phonophobia.  She denies associated visual disturbance or unilateral numbness or weakness. Initial duration:  6-8 hours Initial Frequency:  Severe ones occur every 2 to 3 months.  Otherwise a daily headache Initial Frequency of abortive medication: rarely Triggers: Emotional stress, laying down Relieving factors: Nothing Activity:  Can't function for severe ones She  also has vertigo without headache, lasting 1 day (occasinally a week).  Occurs almost everyday.  Meclizine ineffective. She also reports episodes consistent with ocular migraines  where she sees flashing lights or squiggly lines that occur without headache.  They are brief, a few seconds and only has occurred about once a month.   Past NSAIDS:  Aleve Past analgesics:  Excedrin Migraine, Tylenol, Fioricet Past abortive triptans:  Sumatriptan tablet (ineffective) Past abortive ergotamine:  none Past muscle relaxants:  none Past anti-emetic:  none Past antihypertensive medications:  none Past antidepressant medicatimions:  Venlafaxine XR (caused dream enactment behavior) Past anticonvulsant medications:  topiramate 100mg  (ineffective) Past anti-CGRP:  Aimovig 140mg  (helpful but no longer covered by her insurance) Past vitamins/Herbal/Supplements:  none Past antihistamines/decongestants:  none Other past headache cocktails:  Decadron/toradol/benadryl/reglan, Zofran and morphine (both ineffective) Other past treatment: Meclizine (ineffective), diazepam   Family history of headache:  Son, mom   06/07/2019 MRI BRAIN W WO:  There is no evidence of acute infarct, intracranial hemorrhage, mass, midline shift, or extra-axial fluid collection.  The ventricles and sulci are normal.  A few punctate foci of T2 FLAIR hyperintensity in the cerebral white matter are nonspecific and not considered abnormal for age.  No abnormal enhancement is identified.  Vascular:  Major intracranial vascular flow voids are preserved.   PAST MEDICAL HISTORY: Past Medical History:  Diagnosis Date   Chronic headaches    Depression     MEDICATIONS: Current Outpatient Medications on File Prior to Visit  Medication Sig Dispense Refill   AJOVY 225 MG/1.5ML SOAJ INJECT 225MG  INTO THE SKIN EVERY 28 DAYS 1.5 mL 0   amitriptyline (ELAVIL) 25 MG tablet Take 2 tablets at bedtime for a week, then 1 tablet at bedtime for a week, then STOP (Patient taking differently: at bedtime. Take 2 tablets at bedtime for a week, then 1 tablet at bedtime for a week, then STOP) 30 tablet 21   buPROPion (WELLBUTRIN XL) 300  MG 24 hr tablet Take 1 tablet by mouth every morning.     busPIRone (BUSPAR) 10 MG tablet Take by mouth. 3 in the AM, 2-3 additional PRN     diazepam (VALIUM) 2 MG tablet Take 1 tablet (2 mg total) by mouth every 4 (four) hours as needed. (Patient not taking: No sig reported) 30 tablet 0   ergotamine-caffeine (CAFERGOT) 1-100 MG tablet 1 to 2 tablets at onset of migraine.  May repeat every 1h up to maximum of 4 tablets in 24h 30 tablet 3   escitalopram (LEXAPRO) 20 MG tablet Take 20 mg by mouth daily.     levonorgestrel-ethinyl estradiol (SEASONALE) 0.15-0.03 MG tablet Take 1 tablet by mouth daily.     metFORMIN (GLUCOPHAGE) 850 MG tablet Take 850 mg by mouth 2 (two) times daily with a meal.      predniSONE (STERAPRED UNI-PAK 21 TAB) 10 MG (21) TBPK tablet take 60mg  day 1, then 50mg  day 2, then 40mg  day 3, then 30mg  day 4, then 20mg  day 5, then 10mg  day 6, then STOP 21 tablet 0   promethazine (PHENERGAN) 25 MG tablet Take 1 tablet (25 mg total) by mouth every 6 (six) hours as needed for nausea or vomiting. 30 tablet 5   simvastatin (ZOCOR) 10 MG tablet Take by mouth.     topiramate (TOPAMAX) 25 MG tablet TAKE 1 TABLET BY MOUTH TWICE DAILY WITH 50MG  180 tablet 0   topiramate (TOPAMAX) 50 MG tablet TAKE 1 TABLET BY MOUTH TWICE DAILY WITH 25  MG TABLET 60 tablet 0   No current facility-administered medications on file prior to visit.    ALLERGIES: Allergies  Allergen Reactions   Lortab [Hydrocodone-Acetaminophen]     Migarines   Other     DHE - increased blood pressure    FAMILY HISTORY: Family History  Problem Relation Age of Onset   Breast cancer Mother    Lung cancer Father       Objective:  Blood pressure 138/84, pulse 96, height 5\' 5"  (1.651 m), weight 191 lb 9.6 oz (86.9 kg), SpO2 97 %. General: No acute distress.  Patient appears well-groomed.   Head:  Normocephalic/atraumatic Eyes:  Fundi examined but not visualized Neck: supple, no paraspinal tenderness, full range of  motion Heart:  Regular rate and rhythm Lungs:  Clear to auscultation bilaterally Back: No paraspinal tenderness Neurological Exam: alert and oriented to person, place, and time.  Speech fluent and not dysarthric, language intact.  CN II-XII intact. Bulk and tone normal, muscle strength 5/5 throughout.  Very mild postural and kinetic tremor in hands.  Sensation to light touch intact.  Deep tendon reflexes 2+ throughout, toes downgoing.  Finger to nose testing intact.  Gait normal, Romberg with sway   Shon Millet, DO  CC: Christena Flake, MD

## 2022-01-05 ENCOUNTER — Other Ambulatory Visit: Payer: Self-pay

## 2022-01-05 ENCOUNTER — Encounter: Payer: Self-pay | Admitting: Neurology

## 2022-01-05 ENCOUNTER — Ambulatory Visit: Payer: BC Managed Care – PPO | Admitting: Neurology

## 2022-01-05 ENCOUNTER — Other Ambulatory Visit (INDEPENDENT_AMBULATORY_CARE_PROVIDER_SITE_OTHER): Payer: BC Managed Care – PPO

## 2022-01-05 VITALS — BP 138/84 | HR 96 | Ht 65.0 in | Wt 191.6 lb

## 2022-01-05 DIAGNOSIS — R251 Tremor, unspecified: Secondary | ICD-10-CM

## 2022-01-05 DIAGNOSIS — G43009 Migraine without aura, not intractable, without status migrainosus: Secondary | ICD-10-CM | POA: Diagnosis not present

## 2022-01-05 DIAGNOSIS — G43109 Migraine with aura, not intractable, without status migrainosus: Secondary | ICD-10-CM

## 2022-01-05 LAB — TSH: TSH: 1.49 u[IU]/mL (ref 0.35–5.50)

## 2022-01-05 MED ORDER — TOPIRAMATE 50 MG PO TABS: ORAL_TABLET | ORAL | 3 refills | Status: DC

## 2022-01-05 MED ORDER — TOPIRAMATE 25 MG PO TABS: ORAL_TABLET | ORAL | 3 refills | Status: DC

## 2022-01-05 MED ORDER — AJOVY 225 MG/1.5ML ~~LOC~~ SOAJ: SUBCUTANEOUS | 3 refills | Status: DC

## 2022-01-05 MED ORDER — ERGOTAMINE-CAFFEINE 1-100 MG PO TABS: ORAL_TABLET | ORAL | 3 refills | Status: DC

## 2022-01-05 NOTE — Patient Instructions (Addendum)
Refilled Ajovy, topiramate and cafergot ?See if you can decrease dose of wellbutrin.  If tremors not improved we can consider increasing topiramate ?Check TSH ?Follow up 6 months. ?

## 2022-01-06 NOTE — Progress Notes (Signed)
LMOVM to call the office back.

## 2022-01-12 ENCOUNTER — Telehealth: Payer: Self-pay

## 2022-01-12 NOTE — Telephone Encounter (Signed)
New message     Your information has been sent to [facility].  Julieanne Cotton Key: [id number] - PA Case ID: [id number] - Rx #: [id number] Need help? Call us at [phone] Status Sent to [place] Drug Ergotamine-Caffeine 1-100MG  tablets Form [facility] Electronic Prior Authorization Form (2017 NCPDP) Original Claim Info 75 PA required, please call [phone] Drug Requires Prior Authorization

## 2022-01-12 NOTE — Telephone Encounter (Signed)
F/u  Mauricia Area Key: [id number] - PA Case ID: [id number] - Rx #[id number] Need help? Call us at [phone] Outcome Approvedtoday Request Reference Number: [id number]. ERGOT/CAFFEN TAB 1-100MG  is approved through 04/14/2022. Your patient may now fill this prescription and it will be covered. Drug Ergotamine-Caffeine 1-100MG  tablets Form [facility] Electronic Prior Authorization Form (2017 NCPDP) Original Claim Info 32 PA required, please call [phone] Drug Requires Prior Authorization

## 2022-01-14 ENCOUNTER — Telehealth: Payer: Self-pay | Admitting: Neurology

## 2022-01-14 NOTE — Telephone Encounter (Signed)
Pt called in stating she needs prior authorization for her Ajovy. ?

## 2022-01-14 NOTE — Progress Notes (Signed)
Mauricia Area (KeyTonye Becket) ? ?[facility] is reviewing your PA request. Typically an electronic response will be received within 24-72 hours. To check for an update later, open this request from your dashboard. ? ?You may close this dialog and return to your dashboard to perform other tasks. ?

## 2022-01-14 NOTE — Progress Notes (Signed)
Vanessa Huff - PA Case ID: [id number] Need help? Call us at [phone] ?Outcome ?Approvedtoday ?Request Reference Number: [id number]. AJOVY INJ 225/1.5 is approved through 01/15/2023. Your patient may now fill this prescription and it will be covered. ?Drug ?AJOVY (fremanezumab-vfrm) injection 225MG /1.5ML auto-injectors ?Form ?[facility] Electronic Prior Authorization Form [id number] NCPDP). ? ? ? ?Pt advised.  ?

## 2022-01-14 NOTE — Telephone Encounter (Signed)
Advised pt will get started on the PA today.  ?

## 2022-02-21 ENCOUNTER — Telehealth: Payer: Self-pay | Admitting: Neurology

## 2022-02-21 ENCOUNTER — Other Ambulatory Visit: Payer: Self-pay | Admitting: Neurology

## 2022-02-21 MED ORDER — DIAZEPAM 2 MG PO TABS: 2.0000 mg | ORAL_TABLET | Freq: Four times a day (QID) | ORAL | 0 refills | Status: DC | PRN

## 2022-02-21 NOTE — Telephone Encounter (Signed)
Patient called and stated she is having severe vertigo, couldn't go to work today.  She wanted to know if prednisone would help.  She also stated she had taken valium before and wanted to know if she could get an RX called in. ?

## 2022-07-07 NOTE — Progress Notes (Signed)
NEUROLOGY FOLLOW UP OFFICE NOTE  Vinaya Fodera [id number]  Assessment/Plan:   1.  Migraine without aura 2.  Migrainous vertigo    1.   Migraine prevention:  Ajovy monthly and topiramate 75mg  twice daily  2.  Migraine rescue:  Cafergot and Phenergan, diazepam 2mg  for vertigo attacks 3.  Advised to discuss reducing dose of Wellbutrin with prescribing provider.  If tremor does not improve, consider increasing topiramate to 100mg  twice daily.  Check TSH 4.  Limit use of pain relievers to no more than 2 days out of week to prevent risk of rebound or medication-overuse headache. 5.  Keep headache diary 6.  Follow up 9 months   Subjective:  Auriel Deltoro is a 46 year old woman with depression, generalized anxiety disorder, type 2 diabetes and vestibular migraine who follows up for migraine.   UPDATE: Current preventative:  Ajovy and topiramate Current rescue:  Cafergot and Phenergan, diazepam for vertigo   Intensity:  Moderate Duration:  Couple of hours Frequency:  1 migraine every 2 months.   Last vertigo spell a couple of months ago Feels off-balance pretty persistent.     Since end of December, she reports hand tremors.  Onset may have correlated with increasing dose of Wellbutrin but her father had hand tremors.  Causes difficulty giving shots.  Improved after decreasing dose of the Wellbutrin  Rescue protocol for common migraine:  Cafergot; phenrgan for nausea Frequency of abortive medication: Cafergot every 2 to 3 weeks Current NSAIDS:  Ibuprofen (rarely, usually just deals with the headache) Current analgesics:  none Current triptans:  none Current ergotamine:   Cafergot (has not needed it) Current anti-emetic:  Phenergan 25mg  Current muscle relaxants:  none Current anti-anxiolytic:   buspirone 10mg  2-3 tablets daily Current sleep aide:  none Current Antihypertensive medications:  none Current Antidepressant medications:  Lexparo, Wellbutrin, amitriptyline 75mg   at bedtime (helps her sleep) Current Anticonvulsant medications:  topirmate 75mg  twice daily (helps with vertigo) Current anti-CGRP:   Ajovy Current Vitamins/Herbal/Supplements:  D2 Current Antihistamines/Decongestants:  none Other therapy: diazepam 2mg  Q6h PRN (for vertigo) Hormone/birth control: seasonale Other medications:  BuSpar   Caffeine:  No coffee or soda Diet:  Only water Exercise:  Karate, kickboxing Depression: stable; Anxiety:  yes Other pain:  no Sleep hygiene:  Good with amitriptyline.  She "acts out" in her dreams.  She had a sleep study but she wasn't symptomatic that night.  She woke up multiple times.  She was referred to sleep medicine at [facility].  Plan is to evaluate for narcolepsy.  Genetic testing negative.  However, she would need to taper off of amitriptyline for PSG/MSLT.  Weaning off of topiramate, amitriptyline and venlafaxine.  Sleep study was normal.  When she weaned off of venlafaxine, her narcolepsy symptoms resolved.  However, her psychiatrist took her off of amitriptyline due to concern for serotonin syndrome.   HISTORY: Onset: Since adolescence Location:  right temple Quality:  Usually non-throbbing ache, sometimes stabbing Initial intensity:  Usually mild-moderate, then sometimes severe.  She denies new headache, thunderclap headache  Aura:  no Prodrome:  no Postdrome:  no Associated symptoms: Nausea, vertigo, photophobia, phonophobia.  She denies associated visual disturbance or unilateral numbness or weakness. Initial duration:  6-8 hours Initial Frequency:  Severe ones occur every 2 to 3 months.  Otherwise a daily headache Initial Frequency of abortive medication: rarely Triggers: Emotional stress, laying down Relieving factors: Nothing Activity:  Can't function for severe ones She also has vertigo without headache, lasting 1 day (  occasinally a week).  Occurs almost everyday.  Meclizine ineffective. She also reports episodes consistent with ocular  migraines where she sees flashing lights or squiggly lines that occur without headache.  They are brief, a few seconds and only has occurred about once a month.   Past NSAIDS:  Aleve Past analgesics:  Excedrin Migraine, Tylenol, Fioricet Past abortive triptans:  Sumatriptan tablet (ineffective) Past abortive ergotamine:  none Past muscle relaxants:  none Past anti-emetic:  none Past antihypertensive medications:  none Past antidepressant medicatimions:  Venlafaxine XR (caused dream enactment behavior) Past anticonvulsant medications:  topiramate 100mg  (ineffective) Past anti-CGRP:  Aimovig 140mg  (helpful but no longer covered by her insurance) Past vitamins/Herbal/Supplements:  none Past antihistamines/decongestants:  none Other past headache cocktails:  Decadron/toradol/benadryl/reglan, Zofran and morphine (both ineffective) Other past treatment: Meclizine (ineffective), diazepam   Family history of headache:  Son, mom   06/07/2019 MRI BRAIN W WO:  There is no evidence of acute infarct, intracranial hemorrhage, mass, midline shift, or extra-axial fluid collection.  The ventricles and sulci are normal.  A few punctate foci of T2 FLAIR hyperintensity in the cerebral white matter are nonspecific and not considered abnormal for age.  No abnormal enhancement is identified.  Vascular:  Major intracranial vascular flow voids are preserved.   PAST MEDICAL HISTORY: Past Medical History:  Diagnosis Date   Chronic headaches    Depression     MEDICATIONS: Current Outpatient Medications on File Prior to Visit  Medication Sig Dispense Refill   buPROPion (WELLBUTRIN XL) 300 MG 24 hr tablet Take 1 tablet by mouth every morning.     busPIRone (BUSPAR) 10 MG tablet Take by mouth. 3 in the AM, 2-3 additional PRN     escitalopram (LEXAPRO) 20 MG tablet Take 20 mg by mouth daily.     Fremanezumab-vfrm (AJOVY) 225 MG/1.5ML SOAJ INJECT 225MG  INTO THE SKIN EVERY 28 DAYS 4.5 mL 3   simvastatin (ZOCOR) 10  MG tablet Take by mouth.     topiramate (TOPAMAX) 25 MG tablet TAKE 1 TABLET BY MOUTH TWICE DAILY WITH 50MG  180 tablet 3   topiramate (TOPAMAX) 50 MG tablet TAKE 1 TABLET BY MOUTH TWICE DAILY WITH 25 MG TABLET 180 tablet 3   lamoTRIgine (LAMICTAL) 200 MG tablet Take 200 mg by mouth daily.     medroxyPROGESTERone (PROVERA) 10 MG tablet Take by mouth.     No current facility-administered medications on file prior to visit.     ALLERGIES: Allergies  Allergen Reactions   Lortab [Hydrocodone-Acetaminophen]     Migarines   Other     DHE - increased blood pressure    FAMILY HISTORY: Family History  Problem Relation Age of Onset   Breast cancer Mother    Lung cancer Father       Objective:  Blood pressure 129/88, pulse 80, height 5\' 1"  (1.549 m), weight 170 lb 12.8 oz (77.5 kg). General: No acute distress.  Patient appears well-groomed.   Head:  Normocephalic/atraumatic Eyes:  Fundi examined but not visualized Neck: supple, no paraspinal tenderness, full range of motion Heart:  Regular rate and rhythm Neurological Exam: alert and oriented to person, place, and time.  Speech fluent and not dysarthric, language intact.  CN II-XII intact. Bulk and tone normal, muscle strength 5/5 throughout.  Very mild postural and kinetic tremor in hands.  Sensation to light touch intact.  Deep tendon reflexes 2+ throughout.  Finger to nose testing intact.  Gait normal, Romberg with sway   , DO  CC: Christena Flake, MD

## 2022-07-12 ENCOUNTER — Ambulatory Visit: Payer: BC Managed Care – PPO | Admitting: Neurology

## 2022-07-12 ENCOUNTER — Encounter: Payer: Self-pay | Admitting: Neurology

## 2022-07-12 VITALS — BP 129/88 | HR 80 | Ht 61.0 in | Wt 170.8 lb

## 2022-07-12 DIAGNOSIS — G43109 Migraine with aura, not intractable, without status migrainosus: Secondary | ICD-10-CM | POA: Diagnosis not present

## 2022-07-12 DIAGNOSIS — G43009 Migraine without aura, not intractable, without status migrainosus: Secondary | ICD-10-CM

## 2022-07-12 MED ORDER — PROMETHAZINE HCL 25 MG PO TABS: 25.0000 mg | ORAL_TABLET | Freq: Four times a day (QID) | ORAL | 5 refills | Status: DC | PRN

## 2022-07-12 MED ORDER — DIAZEPAM 2 MG PO TABS: 2.0000 mg | ORAL_TABLET | Freq: Four times a day (QID) | ORAL | 2 refills | Status: DC | PRN

## 2022-07-12 MED ORDER — ERGOTAMINE-CAFFEINE 1-100 MG PO TABS: ORAL_TABLET | ORAL | 5 refills | Status: DC

## 2022-07-14 ENCOUNTER — Telehealth (HOSPITAL_COMMUNITY): Payer: Self-pay | Admitting: Pharmacy Technician

## 2022-07-14 NOTE — Telephone Encounter (Signed)
Patient Advocate Encounter   Received notification that prior authorization for Ergotamine-Caffeine 1-100MG  tablets is required.   PA submitted on 07/14/2022 Key [id number] Status is pending       Roland Earl, CPhT Pharmacy Patient Advocate Specialist [facility] Patient Advocate Team Direct Number: [phone]  Fax: [phone]

## 2022-07-15 NOTE — Telephone Encounter (Signed)
Patient Advocate Encounter  Prior Authorization for Ergotamine-Caffeine 1-100MG  tablets has been approved.    PA# [id number] Effective dates: 07/15/2022 through 07/15/2023      Roland Earl, CPhT Pharmacy Patient Advocate Specialist [facility] Patient Advocate Team Direct Number: [phone]  Fax: [phone]

## 2022-12-12 ENCOUNTER — Other Ambulatory Visit: Payer: Self-pay | Admitting: Neurology

## 2023-03-15 ENCOUNTER — Telehealth: Payer: Self-pay | Admitting: Neurology

## 2023-03-15 ENCOUNTER — Other Ambulatory Visit: Payer: Self-pay | Admitting: Neurology

## 2023-03-15 NOTE — Telephone Encounter (Signed)
LMOVM please give the office a call back.   PA is needed Expired in March 2024.   PA team please start a PA for Ajovy.

## 2023-03-15 NOTE — Telephone Encounter (Signed)
Pt needs a prior auth on the ajovy  please call her

## 2023-03-16 ENCOUNTER — Telehealth: Payer: Self-pay

## 2023-03-16 ENCOUNTER — Other Ambulatory Visit (HOSPITAL_COMMUNITY): Payer: Self-pay

## 2023-03-16 NOTE — Telephone Encounter (Signed)
PA request received via provider for AJOVY (fremanezumab-vfrm) injection 225MG /1.5ML auto-injectors  PA has been submitted to [facility] and is pending determination  Key: [id number]

## 2023-03-16 NOTE — Telephone Encounter (Signed)
See additional encounter for updates  

## 2023-03-29 NOTE — Telephone Encounter (Signed)
PA has been APPROVED from 03/16/2023-03/15/2024

## 2023-07-14 ENCOUNTER — Ambulatory Visit: Payer: BC Managed Care – PPO | Admitting: Neurology

## 2023-07-26 NOTE — Progress Notes (Signed)
NEUROLOGY FOLLOW UP OFFICE NOTE  Vanessa Huff [id number]  Assessment/Plan:   1.  Migraine without aura 2.  Vestibular migraine     1.   Migraine prevention:  Ajovy monthly, Increase topiramate to 100mg  twice daily   2.  Migraine rescue:  Cafergot and Phenergan, diazepam 2mg  for vertigo attacks  3.  Will refer to the vestibular clinic at [facility] for evaluation as her symptoms persist 3.  Limit use of pain relievers to no more than 2 days out of week to prevent risk of rebound or medication-overuse headache. 5.  Keep headache diary 6.  Follow up 6 months.   Subjective:  Vanessa Huff is a 47 year old woman with depression, generalized anxiety disorder, type 2 diabetes and vestibular migraine who follows up for migraine.   UPDATE: Current preventative:  Ajovy and topiramate Current rescue:  Cafergot and Phenergan, diazepam for vertigo   Migraines: Intensity:  Mild Duration:  Couple of hours Frequency:  2 to 3 a month.   In August, she was a week late to take her Ajovy and migraines were triggered.  She had to take the next injection one week early and it calmed down.  However, still with vertigo. Vertigo increased since increased headaches in August.  Every time she moves her head to the left or lays down, her head spins.  Loses balance if she moves too fast.    Rescue protocol for common migraine:  Cafergot; phenrgan for nausea Frequency of abortive medication: Cafergot every 2 to 3 weeks Current NSAIDS:  Ibuprofen (rarely, usually just deals with the headache) Current analgesics:  none Current triptans:  none Current ergotamine:   Cafergot (has not needed it) Current anti-emetic:  Phenergan 25mg  Current muscle relaxants:  none Current anti-anxiolytic:   buspirone 10mg  2-3 tablets daily Current sleep aide:  none Current Antihypertensive medications:  none Current Antidepressant medications:  Lexparo, Wellbutrin, amitriptyline 75mg  at bedtime (helps her  sleep) Current Anticonvulsant medications:  topirmate 75mg  twice daily (helps with vertigo) Current anti-CGRP:   Ajovy Current Vitamins/Herbal/Supplements:  D2 Current Antihistamines/Decongestants:  none Other therapy: diazepam 2mg  Q6h PRN (for vertigo) Hormone/birth control: seasonale Other medications:  BuSpar   Caffeine:  No coffee or soda Diet:  Only water Exercise:  Karate, kickboxing Depression: stable; Anxiety:  yes Other pain:  no Sleep hygiene:  Good with amitriptyline.  She "acts out" in her dreams.  She had a sleep study but she wasn't symptomatic that night.  She woke up multiple times.  She was referred to sleep medicine at [facility]  Plan is to evaluate for narcolepsy.  Genetic testing negative.  However, she would need to taper off of amitriptyline for PSG/MSLT.  Weaning off of topiramate, amitriptyline and venlafaxine.  Sleep study was normal.  When she weaned off of venlafaxine, her narcolepsy symptoms resolved.  However, her psychiatrist took her off of amitriptyline due to concern for serotonin syndrome.   HISTORY: Onset: Since adolescence Location:  right temple Quality:  Usually non-throbbing ache, sometimes stabbing Initial intensity:  Usually mild-moderate, then sometimes severe.  She denies new headache, thunderclap headache  Aura:  no Prodrome:  no Postdrome:  no Associated symptoms: Nausea, vertigo, photophobia, phonophobia.  She denies associated visual disturbance or unilateral numbness or weakness. Initial duration:  6-8 hours Initial Frequency:  Severe ones occur every 2 to 3 months.  Otherwise a daily headache Initial Frequency of abortive medication: rarely Triggers: Emotional stress, laying down Relieving factors: Nothing Activity:  Can't function for severe ones  She also has vertigo without headache, lasting 1 day (occasinally a week).  Occurs almost everyday.  Meclizine ineffective. She also reports episodes consistent with ocular migraines where she sees  flashing lights or squiggly lines that occur without headache.  They are brief, a few seconds and only has occurred about once a month.  Since end of December 2023, she reports hand tremors.  Onset may have correlated with increasing dose of Wellbutrin but her father had hand tremors.  Causes difficulty giving shots.  Improved after decreasing dose of the Wellbutrin   Past NSAIDS:  Aleve Past analgesics:  Excedrin Migraine, Tylenol, Fioricet Past abortive triptans:  Sumatriptan tablet (ineffective) Past abortive ergotamine:  none Past muscle relaxants:  none Past anti-emetic:  none Past antihypertensive medications:  none Past antidepressant medicatimions:  Venlafaxine XR (caused dream enactment behavior) Past anticonvulsant medications:  topiramate 100mg  (ineffective) Past anti-CGRP:  Aimovig 140mg  (helpful but no longer covered by her insurance) Past vitamins/Herbal/Supplements:  none Past antihistamines/decongestants:  none Other past headache cocktails:  Decadron/toradol/benadryl/reglan, Zofran and morphine (both ineffective) Other past treatment: Meclizine (ineffective), diazepam, vestibular rehab   Family history of headache:  Son, mom   06/07/2019 MRI BRAIN W WO:  There is no evidence of acute infarct, intracranial hemorrhage, mass, midline shift, or extra-axial fluid collection.  The ventricles and sulci are normal.  A few punctate foci of T2 FLAIR hyperintensity in the cerebral white matter are nonspecific and not considered abnormal for age.  No abnormal enhancement is identified.  Vascular:  Major intracranial vascular flow voids are preserved.   PAST MEDICAL HISTORY: Past Medical History:  Diagnosis Date   Adenomyosis uterus    Bipolar 1 disorder (HCC)    Chronic headaches    Depression     MEDICATIONS: Current Outpatient Medications on File Prior to Visit  Medication Sig Dispense Refill   AJOVY 225 MG/1.5ML SOAJ INJECT 225MG  INTO THE SKIN EVERY 28 DAYS 4.5 mL 3    buPROPion (WELLBUTRIN XL) 300 MG 24 hr tablet Take 1 tablet by mouth every morning.     busPIRone (BUSPAR) 10 MG tablet Take by mouth. 3 in the AM, 2-3 additional PRN     diazepam (VALIUM) 2 MG tablet Take 1 tablet (2 mg total) by mouth every 6 (six) hours as needed. 10 tablet 2   ergotamine-caffeine (CAFERGOT) 1-100 MG tablet 1 to 2 tablets at onset of migraine.  May repeat every 1h up to maximum of 4 tablets in 24h 30 tablet 5   escitalopram (LEXAPRO) 20 MG tablet Take 20 mg by mouth daily.     lamoTRIgine (LAMICTAL) 200 MG tablet Take 200 mg by mouth daily.     medroxyPROGESTERone (PROVERA) 10 MG tablet Take by mouth.     promethazine (PHENERGAN) 25 MG tablet Take 1 tablet (25 mg total) by mouth every 6 (six) hours as needed for nausea or vomiting. 30 tablet 5   simvastatin (ZOCOR) 10 MG tablet Take by mouth.     topiramate (TOPAMAX) 25 MG tablet TAKE 1 TABLET BY MOUTH TWICE DAILY WITH 50 MG 180 tablet 3   topiramate (TOPAMAX) 50 MG tablet TAKE 1 TABLET BY MOUTH TWICE DAILY WITH 25 MG TABLET 180 tablet 3   No current facility-administered medications on file prior to visit.     ALLERGIES: Allergies  Allergen Reactions   Lortab [Hydrocodone-Acetaminophen]     Migarines   Other     DHE - increased blood pressure    FAMILY HISTORY: Family History  Problem Relation Age of Onset   Breast cancer Mother    Lung cancer Father       Objective:  Blood pressure 111/75, pulse 86, height 5\' 1"  (1.549 m), weight 147 lb 9.6 oz (67 kg), SpO2 98%. General: No acute distress.  Patient appears well-groomed.   Head:  Normocephalic/atraumatic Eyes:  Fundi examined but not visualized Neck: supple, no paraspinal tenderness, full range of motion Heart:  Regular rate and rhythm Neurological Exam: alert and oriented.  Speech fluent and not dysarthric, language intact.  CN II-XII intact. Bulk and tone normal, muscle strength 5/5 throughout.  Postural and kinetic tremor in hands.  Sensation to light  touch intact.  Deep tendon reflexes 2+ throughout.  Finger to nose testing intact.  Cautious gait.  Difficulty tandem walk.  Romberg with sway.   Shon Millet, DO  CC: Christena Flake, MD

## 2023-07-28 ENCOUNTER — Encounter: Payer: Self-pay | Admitting: Neurology

## 2023-07-28 ENCOUNTER — Ambulatory Visit: Payer: BC Managed Care – PPO | Admitting: Neurology

## 2023-07-28 VITALS — BP 111/75 | HR 86 | Ht 61.0 in | Wt 147.6 lb

## 2023-07-28 DIAGNOSIS — G43809 Other migraine, not intractable, without status migrainosus: Secondary | ICD-10-CM

## 2023-07-28 DIAGNOSIS — G43009 Migraine without aura, not intractable, without status migrainosus: Secondary | ICD-10-CM

## 2023-07-28 MED ORDER — PROMETHAZINE HCL 25 MG PO TABS: 25.0000 mg | ORAL_TABLET | Freq: Four times a day (QID) | ORAL | 5 refills | Status: DC | PRN

## 2023-07-28 MED ORDER — TOPIRAMATE 100 MG PO TABS: 100.0000 mg | ORAL_TABLET | Freq: Two times a day (BID) | ORAL | 5 refills | Status: DC

## 2023-07-28 MED ORDER — ERGOTAMINE-CAFFEINE 1-100 MG PO TABS: ORAL_TABLET | ORAL | 5 refills | Status: DC

## 2023-07-28 MED ORDER — DIAZEPAM 2 MG PO TABS: 2.0000 mg | ORAL_TABLET | Freq: Four times a day (QID) | ORAL | 2 refills | Status: DC | PRN

## 2023-07-28 MED ORDER — AJOVY 225 MG/1.5ML ~~LOC~~ SOAJ: SUBCUTANEOUS | 3 refills | Status: DC

## 2023-07-28 NOTE — Patient Instructions (Signed)
Increase topiramate to 100mg  twice daily Continue Ajovy every 4 weeks Cafergot and promethazine for migraine attacks.  Diazepam to help with acute vertigo attacks Refer to vestibular clinic at [facility]

## 2023-08-04 ENCOUNTER — Telehealth: Payer: Self-pay

## 2023-08-04 NOTE — Telephone Encounter (Signed)
PA needed Ergotamine

## 2023-08-14 NOTE — Telephone Encounter (Signed)
Patient called and left VM stating that she is needing a PA for medication Ergotamine

## 2023-08-24 ENCOUNTER — Telehealth: Payer: Self-pay | Admitting: Pharmacy Technician

## 2023-08-24 NOTE — Telephone Encounter (Signed)
PA has been submitted, and telephone encounter has been created. 

## 2023-08-24 NOTE — Telephone Encounter (Signed)
Pharmacy Patient Advocate Encounter   Received notification from CoverMyMeds that prior authorization for Ergotamine-Caffeine is required/requested.   Insurance verification completed.   The patient is insured through [facility] .   Per test claim: PA required; PA submitted to [facility] via CoverMyMeds Key/confirmation #/EOC [facility] Status is pending

## 2023-10-27 ENCOUNTER — Telehealth: Payer: Self-pay | Admitting: Neurology

## 2023-10-27 NOTE — Telephone Encounter (Signed)
Patient states that she was referred to a Vertigo clinic and needs to see if she can get that phone number to call them. She has not heard anything from them

## 2023-10-27 NOTE — Telephone Encounter (Signed)
Information sent via my chart to patient

## 2024-01-26 NOTE — Telephone Encounter (Signed)
 Pharmacy Patient Advocate Encounter  Received notification from [facility] that Prior Authorization for ERGOTAMINE-CAFFEINE has been APPROVED from 10.17.24 to 10.17.25   PA #/Case ID/Reference #: [id number]

## 2024-01-26 NOTE — Progress Notes (Signed)
 NEUROLOGY FOLLOW UP OFFICE NOTE  Vanessa Huff [id number]  Assessment/Plan:   1.  Migraine without aura, without status migrainosus, not intractable 2.  Vestibular migraine     1.   Migraine prevention:  Change from Ajovy to [facility] every 28 days, topiramate to 100mg  twice daily  2.  Migraine rescue:  Cafergot and Phenergan, diazepam 2mg  for vertigo attacks 3.  Vestibular rehab 4.  Limit use of pain relievers to no more than 2 days out of week to prevent risk of rebound or medication-overuse headache. 5.  Keep headache diary 6.  Follow up 6 months.   Subjective:  Vanessa Huff is a 48 year old woman with depression, generalized anxiety disorder, type 2 diabetes and vestibular migraine who follows up for migraine.   UPDATE: Current preventative:  Ajovy, topiramate 100mg  twice daily Current rescue:  Cafergot and Phenergan, diazepam for vertigo   Due to worsening vertigo, increased topiramate in September. Noted some improvement in vertigo but has gotten worse when the headaches increased. And referred to PT for vestibular rehab but never received a call to schedule.  Typical migraines  Increasing over the past 2 months. Intensity:  Mild Duration:  Couple of hours with Cafergot. Frequency: 2-3 moderate a week, 3 were severe In August, she was a week late to take her Ajovy and migraines were triggered.  She had to take the next injection one week early and it calmed down.  However, still with vertigo.   Rescue protocol for common migraine:  Cafergot; phenrgan for nausea Frequency of abortive medication: Cafergot every 2 to 3 weeks Current NSAIDS:  Ibuprofen (rarely, usually just deals with the headache) Current analgesics:  none Current triptans:  none Current ergotamine:   Cafergot (has not needed it) Current anti-emetic:  Phenergan 25mg  Current muscle relaxants:  none Current anti-anxiolytic:   buspirone 10mg  2-3 tablets daily Current sleep aide:   none Current Antihypertensive medications:  none Current Antidepressant medications:  Lexparo 20mg , Wellbutrin Current Anticonvulsant medications:  topiramate 100mg  twice daily (helps with vertigo), gabapentin 300mg  PRN Current anti-CGRP:   Ajovy Current Vitamins/Herbal/Supplements:  D2 Current Antihistamines/Decongestants:  none Other therapy: diazepam 2mg  Q6h PRN (for vertigo) Hormone/birth control: seasonale Other medications:  BuSpar   Caffeine:  No coffee or soda Diet:  Only water Exercise:  Karate, kickboxing Depression: stable; Anxiety:  yes Other pain:  no Sleep hygiene:  none    HISTORY: Onset: Since adolescence Location:  right temple Quality:  Usually non-throbbing ache, sometimes stabbing Initial intensity:  Usually mild-moderate, then sometimes severe.  She denies new headache, thunderclap headache  Aura:  no Prodrome:  no Postdrome:  no Associated symptoms: Nausea, vertigo, photophobia, phonophobia.  She denies associated visual disturbance or unilateral numbness or weakness. Initial duration:  6-8 hours Initial Frequency:  Severe ones occur every 2 to 3 months.  Otherwise a daily headache Initial Frequency of abortive medication: rarely Triggers: Emotional stress, laying down Relieving factors: Nothing Activity:  Can't function for severe ones She also has vertigo without headache, lasting 1 day (occasinally a week).  Occurs almost everyday.  Meclizine ineffective. She also reports episodes consistent with ocular migraines where she sees flashing lights or squiggly lines that occur without headache.  They are brief, a few seconds and only has occurred about once a month.  Since end of December 2023, she reports hand tremors.  Onset may have correlated with increasing dose of Wellbutrin but her father had hand tremors.  Causes difficulty giving shots.  Improved after decreasing dose  of the Wellbutrin   Past NSAIDS:  Aleve Past analgesics:  Excedrin Migraine,  Tylenol, Fioricet Past abortive triptans:  Sumatriptan tablet (ineffective) Past abortive ergotamine:  none Past muscle relaxants:  none Past anti-emetic:  none Past antihypertensive medications:  none Past antidepressant medicatimions:  amitriptyline 75mg  at bedtime, Venlafaxine XR (caused dream enactment behavior) Past anticonvulsant medications:  none Past anti-CGRP:  Aimovig 140mg  (helpful but no longer covered by her insurance) Past vitamins/Herbal/Supplements:  none Past antihistamines/decongestants:  none Other past headache cocktails:  Decadron/toradol/benadryl/reglan, Zofran and morphine (both ineffective) Other past treatment: Meclizine (ineffective), diazepam, vestibular rehab   Family history of headache:  Son, mom   06/07/2019 MRI BRAIN W WO:  There is no evidence of acute infarct, intracranial hemorrhage, mass, midline shift, or extra-axial fluid collection.  The ventricles and sulci are normal.  A few punctate foci of T2 FLAIR hyperintensity in the cerebral white matter are nonspecific and not considered abnormal for age.  No abnormal enhancement is identified.  Vascular:  Major intracranial vascular flow voids are preserved.   PAST MEDICAL HISTORY: Past Medical History:  Diagnosis Date   Adenomyosis uterus    Bipolar 1 disorder (HCC)    Chronic headaches    Depression     MEDICATIONS: Current Outpatient Medications on File Prior to Visit  Medication Sig Dispense Refill   buPROPion (WELLBUTRIN XL) 300 MG 24 hr tablet Take 1 tablet by mouth every morning.     busPIRone (BUSPAR) 10 MG tablet Take by mouth. 3 in the AM, 2-3 additional PRN     diazepam (VALIUM) 2 MG tablet Take 1 tablet (2 mg total) by mouth every 6 (six) hours as needed. 10 tablet 2   ergotamine-caffeine (CAFERGOT) 1-100 MG tablet 1 to 2 tablets at onset of migraine.  May repeat every 1h up to maximum of 4 tablets in 24h 30 tablet 5   escitalopram (LEXAPRO) 20 MG tablet Take 20 mg by mouth daily.      Fremanezumab-vfrm (AJOVY) 225 MG/1.5ML SOAJ INJECT 225MG  INTO THE SKIN EVERY 28 DAYS 4.5 mL 3   lamoTRIgine (LAMICTAL) 200 MG tablet Take 200 mg by mouth daily.     lisdexamfetamine (VYVANSE) 50 MG capsule Take 50 mg by mouth daily.     MOUNJARO 5 MG/0.5ML Pen Inject 5 mg into the skin once a week.     promethazine (PHENERGAN) 25 MG tablet Take 1 tablet (25 mg total) by mouth every 6 (six) hours as needed for nausea or vomiting. 30 tablet 5   topiramate (TOPAMAX) 100 MG tablet Take 1 tablet (100 mg total) by mouth 2 (two) times daily. 60 tablet 5   No current facility-administered medications on file prior to visit.     ALLERGIES: Allergies  Allergen Reactions   Lortab [Hydrocodone-Acetaminophen]     Migarines   Other     DHE - increased blood pressure    FAMILY HISTORY: Family History  Problem Relation Age of Onset   Breast cancer Mother    Lung cancer Father       Objective:  Blood pressure 129/70, pulse 89, resp. rate 20, weight 148 lb (67.1 kg), SpO2 98%. General: No acute distress.  Patient appears well-groomed.   Head:  Normocephalic/atraumatic Neck:  Supple.  No paraspinal tenderness.  Full range of motion. Heart:  Regular rate and rhythm. Neuro:  Alert and oriented.  Speech fluent and not dysarthric.  Language intact.  CN II-XII intact.  Bulk and tone normal.  Muscle strength 5/5 throughout.  Deep tendon reflexes 2+ throughout.  Gait normal.  Romberg negative.    Shon Millet, DO  CC: Christena Flake, MD

## 2024-01-29 ENCOUNTER — Ambulatory Visit: Payer: BC Managed Care – PPO | Admitting: Neurology

## 2024-01-29 ENCOUNTER — Encounter: Payer: Self-pay | Admitting: Neurology

## 2024-01-29 VITALS — BP 129/70 | HR 89 | Resp 20 | Wt 148.0 lb

## 2024-01-29 DIAGNOSIS — G43009 Migraine without aura, not intractable, without status migrainosus: Secondary | ICD-10-CM | POA: Diagnosis not present

## 2024-01-29 DIAGNOSIS — G43809 Other migraine, not intractable, without status migrainosus: Secondary | ICD-10-CM | POA: Diagnosis not present

## 2024-01-29 MED ORDER — TOPIRAMATE 100 MG PO TABS: 100.0000 mg | ORAL_TABLET | Freq: Two times a day (BID) | ORAL | 5 refills | Status: DC

## 2024-01-29 MED ORDER — ERGOTAMINE-CAFFEINE 1-100 MG PO TABS: ORAL_TABLET | ORAL | 5 refills | Status: DC

## 2024-01-29 MED ORDER — EMGALITY 120 MG/ML ~~LOC~~ SOAJ: 120.0000 mg | SUBCUTANEOUS | 5 refills | Status: DC

## 2024-01-29 MED ORDER — EMGALITY 120 MG/ML ~~LOC~~ SOAJ: 240.0000 mg | Freq: Once | SUBCUTANEOUS | 0 refills | Status: AC

## 2024-01-29 MED ORDER — DIAZEPAM 2 MG PO TABS: 2.0000 mg | ORAL_TABLET | Freq: Four times a day (QID) | ORAL | 2 refills | Status: AC | PRN

## 2024-01-29 MED ORDER — PROMETHAZINE HCL 25 MG PO TABS: 25.0000 mg | ORAL_TABLET | Freq: Four times a day (QID) | ORAL | 5 refills | Status: AC | PRN

## 2024-01-29 NOTE — Patient Instructions (Addendum)
 Change from Ajovy to [employer]  If no improvement in 3 months, contact me. Topiramate 100mg  twice daily Cafergot and promethazine as needed. Refer to vestibular rehab Follow up 6 months.

## 2024-01-29 NOTE — Addendum Note (Signed)
 Addended by: Allean Found R on: 01/29/2024 11:14 AM   Modules accepted: Orders

## 2024-02-05 ENCOUNTER — Telehealth: Payer: Self-pay

## 2024-02-05 NOTE — Telephone Encounter (Signed)
 PA needed for [employer]

## 2024-02-06 ENCOUNTER — Telehealth: Payer: Self-pay | Admitting: Pharmacy Technician

## 2024-02-06 ENCOUNTER — Other Ambulatory Visit (HOSPITAL_COMMUNITY): Payer: Self-pay

## 2024-02-06 NOTE — Telephone Encounter (Signed)
 Pharmacy Patient Advocate Encounter   Received notification from [employer] that prior authorization for [facility] 120MG  is required/requested.   Insurance verification completed.   The patient is insured through [facility] .   Per test claim: PA required; PA submitted to above mentioned insurance via CoverMyMeds Key/confirmation #/EOC [id number] Status is pending

## 2024-02-06 NOTE — Telephone Encounter (Signed)
 PA has been submitted, and telephone encounter has been created. Please see telephone encounter dated 4.1.25.

## 2024-02-15 NOTE — Telephone Encounter (Signed)
 Pharmacy Patient Advocate Encounter  Received notification from [facility] that Prior Authorization for Emgality has been DENIED.  Full denial letter will be uploaded to the media tab. See denial reason below.   Per your health plan's criteria, this drug is covered if you meet the following: (1) Your doctor provides medical records (for example: chart notes) showing that you have failed (after at least 8-weeks trial) or cannot use one of the following: Nurtec or Qulipta. The information provided does not show that you meet the criteria listed above.

## 2024-02-16 ENCOUNTER — Other Ambulatory Visit: Payer: Self-pay | Admitting: Neurology

## 2024-02-16 ENCOUNTER — Telehealth: Payer: Self-pay | Admitting: Pharmacist

## 2024-02-16 MED ORDER — QULIPTA 60 MG PO TABS: 60.0000 mg | ORAL_TABLET | Freq: Every day | ORAL | 5 refills | Status: DC

## 2024-02-16 NOTE — Telephone Encounter (Signed)
 Pharmacy Patient Advocate Encounter  Received notification from [facility] that Prior Authorization for Qulipta 60MG  tablets has been APPROVED from 02/16/2024 to 08/17/2024   PA #/Case ID/Reference #: [id number]

## 2024-02-16 NOTE — Telephone Encounter (Signed)
 Pharmacy Patient Advocate Encounter   Received notification from Patient Pharmacy that prior authorization for Qulipta 60MG  tablets is required/requested.   Insurance verification completed.   The patient is insured through [facility] .   Per test claim: PA required; PA submitted to above mentioned insurance via CoverMyMeds Key/confirmation #/EOC [facility] Status is pending

## 2024-06-05 ENCOUNTER — Encounter: Payer: Self-pay | Admitting: Neurology

## 2024-07-30 NOTE — Progress Notes (Deleted)
 NEUROLOGY FOLLOW UP OFFICE NOTE  Vanessa Huff [id number]  Assessment/Plan:   1.  Migraine without aura, without status migrainosus, not intractable 2.  Vestibular migraine     1.   Migraine prevention:  Change from Ajovy  to Emgality  every 28 days, topiramate  to 100mg  twice daily  2.  Migraine rescue:  Cafergot  and Phenergan , diazepam  2mg  for vertigo attacks 3.  Vestibular rehab 4.  Limit use of pain relievers to no more than 2 days out of week to prevent risk of rebound or medication-overuse headache. 5.  Keep headache diary 6.  Follow up 6 months.   Subjective:  Vanessa Huff is a 48 year old woman with depression, generalized anxiety disorder, type 2 diabetes and vestibular migraine who follows up for migraine.   UPDATE: Current preventative:  Qulipta  60mg  daily, topiramate  100mg  twice daily Current rescue:  Cafergot  and Phenergan , diazepam  for vertigo   Insurance would not cover Emgality , so she was started on Qulipta .  She was also referred to vestibular rehab.    Vestibular migraines: ***  Typical migraines : *** Intensity:  Mild Duration:  Couple of hours with Cafergot . Frequency: 2-3 moderate a week, 3 were severe In August, she was a week late to take her Ajovy  and migraines were triggered.  She had to take the next injection one week early and it calmed down.  However, still with vertigo.   Rescue protocol for common migraine:  Cafergot ; phenrgan for nausea Frequency of abortive medication: Cafergot  every 2 to 3 weeks Current NSAIDS:  Ibuprofen (rarely, usually just deals with the headache) Current analgesics:  none Current triptans:  none Current ergotamine:   Cafergot  (has not needed it) Current anti-emetic:  Phenergan  25mg  Current muscle relaxants:  none Current anti-anxiolytic:   buspirone 10mg  2-3 tablets daily Current sleep aide:  none Current Antihypertensive medications:  none Current Antidepressant medications:  Lexparo 20mg ,  Wellbutrin Current Anticonvulsant medications:  topiramate  100mg  twice daily (helps with vertigo), gabapentin 300mg  PRN Current anti-CGRP:   Qulipta  60mg  daily Current Vitamins/Herbal/Supplements:  D2 Current Antihistamines/Decongestants:  none Other therapy: diazepam  2mg  Q6h PRN (for vertigo) Hormone/birth control: seasonale Other medications:  BuSpar   Caffeine :  No coffee or soda Diet:  Only water Exercise:  Karate, kickboxing Depression: stable; Anxiety:  yes Other pain:  no Sleep hygiene:  none    HISTORY: Onset: Since adolescence Location:  right temple Quality:  Usually non-throbbing ache, sometimes stabbing Initial intensity:  Usually mild-moderate, then sometimes severe.  She denies new headache, thunderclap headache  Aura:  no Prodrome:  no Postdrome:  no Associated symptoms: Nausea, vertigo, photophobia, phonophobia.  She denies associated visual disturbance or unilateral numbness or weakness. Initial duration:  6-8 hours Initial Frequency:  Severe ones occur every 2 to 3 months.  Otherwise a daily headache Initial Frequency of abortive medication: rarely Triggers: Emotional stress, laying down Relieving factors: Nothing Activity:  Can't function for severe ones She also has vertigo without headache, lasting 1 day (occasinally a week).  Occurs almost everyday.  Meclizine ineffective. She also reports episodes consistent with ocular migraines where she sees flashing lights or squiggly lines that occur without headache.  They are brief, a few seconds and only has occurred about once a month.  Since end of December 2023, she reports hand tremors.  Onset may have correlated with increasing dose of Wellbutrin but her father had hand tremors.  Causes difficulty giving shots.  Improved after decreasing dose of the Wellbutrin   Past NSAIDS:  Aleve Past analgesics:  Excedrin Migraine, Tylenol, Fioricet Past abortive triptans:  Sumatriptan tablet (ineffective) Past abortive  ergotamine:  none Past muscle relaxants:  none Past anti-emetic:  none Past antihypertensive medications:  none Past antidepressant medicatimions:  amitriptyline  75mg  at bedtime, Venlafaxine XR (caused dream enactment behavior) Past anticonvulsant medications:  none Past anti-CGRP:  Aimovig  140mg  (helpful but no longer covered by her insurance), Ajovy  Past vitamins/Herbal/Supplements:  none Past antihistamines/decongestants:  none Other past headache cocktails:  Decadron/toradol/benadryl/reglan, Zofran and morphine (both ineffective) Other past treatment: Meclizine (ineffective), diazepam , vestibular rehab   Family history of headache:  Son, mom   06/07/2019 MRI BRAIN W WO:  There is no evidence of acute infarct, intracranial hemorrhage, mass, midline shift, or extra-axial fluid collection.  The ventricles and sulci are normal.  A few punctate foci of T2 FLAIR hyperintensity in the cerebral white matter are nonspecific and not considered abnormal for age.  No abnormal enhancement is identified.  Vascular:  Major intracranial vascular flow voids are preserved.   PAST MEDICAL HISTORY: Past Medical History:  Diagnosis Date   Adenomyosis uterus    Bipolar 1 disorder (HCC)    Chronic headaches    Depression     MEDICATIONS: Current Outpatient Medications on File Prior to Visit  Medication Sig Dispense Refill   Atogepant  (QULIPTA ) 60 MG TABS Take 1 tablet (60 mg total) by mouth daily. 30 tablet 5   buPROPion (WELLBUTRIN XL) 300 MG 24 hr tablet Take 1 tablet by mouth every morning.     busPIRone (BUSPAR) 10 MG tablet Take by mouth. 3 in the AM, 2-3 additional PRN     diazepam  (VALIUM ) 2 MG tablet Take 1 tablet (2 mg total) by mouth every 6 (six) hours as needed. 10 tablet 2   ergotamine-caffeine  (CAFERGOT ) 1-100 MG tablet 1 to 2 tablets at onset of migraine.  May repeat every 1h up to maximum of 4 tablets in 24h 30 tablet 5   escitalopram (LEXAPRO) 20 MG tablet Take 20 mg by mouth daily.      gabapentin (NEURONTIN) 300 MG capsule TAKE 1 CAPSULE BY MOUTH IN THE MORNING AND 1 CAPSULE AT NOON AND 1 CAPSULE BEFORE BEDTIME (Patient not taking: Reported on 01/29/2024)     Galcanezumab -gnlm (EMGALITY ) 120 MG/ML SOAJ Inject 120 mg into the skin every 28 (twenty-eight) days. 1.12 mL 5   lamoTRIgine (LAMICTAL) 200 MG tablet Take 200 mg by mouth daily.     lisdexamfetamine (VYVANSE) 50 MG capsule Take 50 mg by mouth daily.     MOUNJARO 5 MG/0.5ML Pen Inject 5 mg into the skin once a week.     promethazine  (PHENERGAN ) 25 MG tablet Take 1 tablet (25 mg total) by mouth every 6 (six) hours as needed for nausea or vomiting. 30 tablet 5   topiramate  (TOPAMAX ) 100 MG tablet Take 1 tablet (100 mg total) by mouth 2 (two) times daily. 60 tablet 5   No current facility-administered medications on file prior to visit.     ALLERGIES: Allergies  Allergen Reactions   Lortab [Hydrocodone-Acetaminophen]     Migarines   Other     DHE - increased blood pressure    FAMILY HISTORY: Family History  Problem Relation Age of Onset   Breast cancer Mother    Lung cancer Father       Objective:  *** General: No acute distress.  Patient appears well-groomed.   ***    Juliene Dunnings, DO  CC: Elsie Jungling, MD

## 2024-07-31 ENCOUNTER — Ambulatory Visit: Admitting: Neurology

## 2024-07-31 ENCOUNTER — Encounter: Payer: Self-pay | Admitting: Neurology

## 2024-08-01 ENCOUNTER — Telehealth: Payer: Self-pay | Admitting: Pharmacy Technician

## 2024-08-01 NOTE — Telephone Encounter (Signed)
 Pharmacy Patient Advocate Encounter   Received notification from Fax that prior authorization for QULIPTA  60MG  is required/requested.   Insurance verification completed.   The patient is insured through [facility] .   Per test claim: PA required; PA submitted to above mentioned insurance via Latent Key/confirmation #/EOC [id number] Status is pending

## 2024-08-02 NOTE — Telephone Encounter (Signed)
 Pharmacy Patient Advocate Encounter  Received notification from [employer] that Prior Authorization for Qulipta  60MG  tablets has been APPROVED from 08-01-2024 to 08-01-2026   PA #/Case ID/Reference #: [id number]

## 2024-08-05 ENCOUNTER — Ambulatory Visit: Admitting: Neurology

## 2024-08-08 ENCOUNTER — Other Ambulatory Visit: Payer: Self-pay | Admitting: Neurology

## 2024-08-08 ENCOUNTER — Telehealth: Payer: Self-pay

## 2024-08-08 MED ORDER — QULIPTA 60 MG PO TABS: 60.0000 mg | ORAL_TABLET | Freq: Every day | ORAL | 1 refills | Status: DC

## 2024-08-08 NOTE — Telephone Encounter (Signed)
 90 day supply for Qulipta  needed for insurance to cover.

## 2024-08-08 NOTE — Progress Notes (Unsigned)
 NEUROLOGY FOLLOW UP OFFICE NOTE  Vanessa Huff [id number]  Assessment/Plan:   1.  Migraine without aura, without status migrainosus, not intractable 2.  Vestibular migraine, intractable     1.   To break current intractable vestibular migraine, prednisone  taper. Migraine prevention:  Qulipta  60mg  daily, topiramate  100mg  twice daily  2.  Migraine rescue:  Cafergot  and Phenergan , diazepam  2mg  for vertigo attacks.  If she should have another intractable migraine not responding to regimen, she will try samples of Nurtec PRN. 3.  Limit use of pain relievers to no more than 9 days out of the month to prevent risk of rebound or medication-overuse headache. 4.  Keep headache diary 5.  Follow up 6 months.   Subjective:  Vanessa Huff is a 48 year old woman with depression, generalized anxiety disorder, type 2 diabetes and vestibular migraine who follows up for migraine.   UPDATE: Current preventative:  Qulipta  60mg  daily, topiramate  100mg  twice daily Current rescue:  Cafergot  and Phenergan , diazepam  for vertigo   Insurance would not cover Emgality , so she was started on Qulipta .  She was also referred to vestibular rehab.  She doesn't feel Qulipta  is as effective as the injections.  Vestibular migraines: Normally they last 4 hours (tolerable after the first 2 hours).  She was symptom-free for a month but then had a severe on last week and still has residual symptoms.  She had slurred speech as well.  Dizziness is 60% better.  Feels it mostly when bending head back.  She thinks that the smells from the chemicals used to clean the floors at work triggered it.    Typical migraines : Improved. Intensity:  Moderate-severe Duration:  Couple of hours with Cafergot . Frequency: 5 to 6 days (3 were severe)   Current medications:   Rescue protocol for common migraine:  Cafergot ; phenrgan for nausea Frequency of abortive medication: Cafergot  every 2 to 3 weeks Current NSAIDS:   Ibuprofen (rarely, usually just deals with the headache) Current analgesics:  none Current triptans:  none Current ergotamine:   Cafergot  (has not needed it) Current anti-emetic:  Phenergan  25mg  Current muscle relaxants:  none Current anti-anxiolytic:   buspirone 10mg  2-3 tablets daily Current sleep aide:  none Current Antihypertensive medications:  none Current Antidepressant medications:  Lexaparo 20mg , Wellbutrin, trazodone 50mg  at bedtime Current Anticonvulsant medications:  topiramate  100mg  twice daily (helps with vertigo), lamotrigine 200mg  daily Current anti-CGRP:   Qulipta  60mg  daily Current Vitamins/Herbal/Supplements:  D2 Current Antihistamines/Decongestants:  none Other therapy: diazepam  2mg  Q6h PRN (for vertigo) Hormone/birth control: seasonale Other medications:  BuSpar   Caffeine :  No coffee or soda Diet:  Only water Exercise:  Karate, kickboxing Depression: stable; Anxiety:  yes Other pain:  no Sleep hygiene:  none    HISTORY: Onset: Since adolescence Location:  right temple Quality:  Usually non-throbbing ache, sometimes stabbing Initial intensity:  Usually mild-moderate, then sometimes severe.  She denies new headache, thunderclap headache  Aura:  no Prodrome:  no Postdrome:  no Associated symptoms: Nausea, vertigo, photophobia, phonophobia.  She denies associated visual disturbance or unilateral numbness or weakness. Initial duration:  6-8 hours Initial Frequency:  Severe ones occur every 2 to 3 months.  Otherwise a daily headache Initial Frequency of abortive medication: rarely Triggers: Emotional stress, laying down Relieving factors: Nothing Activity:  Can't function for severe ones She also has vertigo without headache, lasting 1 day (occasinally a week).  Occurs almost everyday.  Meclizine ineffective. She also reports episodes consistent with ocular migraines where she sees  flashing lights or squiggly lines that occur without headache.  They are  brief, a few seconds and only has occurred about once a month.  Since end of December 2023, she reports hand tremors.  Onset may have correlated with increasing dose of Wellbutrin but her father had hand tremors.  Causes difficulty giving shots.  Improved after decreasing dose of the Wellbutrin   Past NSAIDS:  Aleve Past analgesics:  Excedrin Migraine, Tylenol, Fioricet Past abortive triptans:  Sumatriptan tablet (ineffective) Past abortive ergotamine:  none Past muscle relaxants:  none Past anti-emetic:  none Past antihypertensive medications:  none Past antidepressant medicatimions:  amitriptyline  75mg  at bedtime, Venlafaxine XR (caused dream enactment behavior) Past anticonvulsant medications:  gabapentin Past anti-CGRP:  Aimovig  140mg  (helpful but no longer covered by her insurance), Ajovy  Past vitamins/Herbal/Supplements:  none Past antihistamines/decongestants:  none Other past headache cocktails:  Decadron/toradol/benadryl/reglan, Zofran and morphine (both ineffective) Other past treatment: Meclizine (ineffective), diazepam , vestibular rehab   Family history of headache:  Son, mom   06/07/2019 MRI BRAIN W WO:  There is no evidence of acute infarct, intracranial hemorrhage, mass, midline shift, or extra-axial fluid collection.  The ventricles and sulci are normal.  A few punctate foci of T2 FLAIR hyperintensity in the cerebral white matter are nonspecific and not considered abnormal for age.  No abnormal enhancement is identified.  Vascular:  Major intracranial vascular flow voids are preserved.   PAST MEDICAL HISTORY: Past Medical History:  Diagnosis Date   Adenomyosis uterus    Bipolar 1 disorder (HCC)    Chronic headaches    Depression     MEDICATIONS: Current Outpatient Medications on File Prior to Visit  Medication Sig Dispense Refill   Atogepant  (QULIPTA ) 60 MG TABS Take 1 tablet (60 mg total) by mouth daily. 30 tablet 5   buPROPion (WELLBUTRIN XL) 300 MG 24 hr tablet  Take 1 tablet by mouth every morning.     busPIRone (BUSPAR) 10 MG tablet Take by mouth. 3 in the AM, 2-3 additional PRN     diazepam  (VALIUM ) 2 MG tablet Take 1 tablet (2 mg total) by mouth every 6 (six) hours as needed. 10 tablet 2   ergotamine-caffeine  (CAFERGOT ) 1-100 MG tablet 1 to 2 tablets at onset of migraine.  May repeat every 1h up to maximum of 4 tablets in 24h 30 tablet 5   escitalopram (LEXAPRO) 20 MG tablet Take 20 mg by mouth daily.     gabapentin (NEURONTIN) 300 MG capsule TAKE 1 CAPSULE BY MOUTH IN THE MORNING AND 1 CAPSULE AT NOON AND 1 CAPSULE BEFORE BEDTIME (Patient not taking: Reported on 01/29/2024)     Galcanezumab -gnlm (EMGALITY ) 120 MG/ML SOAJ Inject 120 mg into the skin every 28 (twenty-eight) days. 1.12 mL 5   lamoTRIgine (LAMICTAL) 200 MG tablet Take 200 mg by mouth daily.     lisdexamfetamine (VYVANSE) 50 MG capsule Take 50 mg by mouth daily.     MOUNJARO 5 MG/0.5ML Pen Inject 5 mg into the skin once a week.     promethazine  (PHENERGAN ) 25 MG tablet Take 1 tablet (25 mg total) by mouth every 6 (six) hours as needed for nausea or vomiting. 30 tablet 5   topiramate  (TOPAMAX ) 100 MG tablet Take 1 tablet (100 mg total) by mouth 2 (two) times daily. 60 tablet 5   No current facility-administered medications on file prior to visit.     ALLERGIES: Allergies  Allergen Reactions   Lortab [Hydrocodone-Acetaminophen]     Migarines   Other  DHE - increased blood pressure    FAMILY HISTORY: Family History  Problem Relation Age of Onset   Breast cancer Mother    Lung cancer Father       Objective:  Blood pressure 129/79, pulse 80, height 5' 1 (1.549 m), weight 159 lb 3.2 oz (72.2 kg), SpO2 99%. General: No acute distress.  Patient appears well-groomed.      Juliene Dunnings, DO  CC: Elsie Jungling, MD

## 2024-08-09 ENCOUNTER — Ambulatory Visit: Admitting: Neurology

## 2024-08-09 ENCOUNTER — Encounter: Payer: Self-pay | Admitting: Neurology

## 2024-08-09 VITALS — BP 129/79 | HR 80 | Ht 61.0 in | Wt 159.2 lb

## 2024-08-09 DIAGNOSIS — G43009 Migraine without aura, not intractable, without status migrainosus: Secondary | ICD-10-CM | POA: Diagnosis not present

## 2024-08-09 DIAGNOSIS — G43809 Other migraine, not intractable, without status migrainosus: Secondary | ICD-10-CM | POA: Diagnosis not present

## 2024-08-09 MED ORDER — QULIPTA 60 MG PO TABS: 60.0000 mg | ORAL_TABLET | Freq: Every day | ORAL | 3 refills | Status: DC

## 2024-08-09 MED ORDER — ERGOTAMINE-CAFFEINE 1-100 MG PO TABS: ORAL_TABLET | ORAL | 5 refills | Status: AC

## 2024-08-09 MED ORDER — TOPIRAMATE 100 MG PO TABS: 100.0000 mg | ORAL_TABLET | Freq: Two times a day (BID) | ORAL | 5 refills | Status: DC

## 2024-08-09 MED ORDER — PREDNISONE 10 MG PO TABS: ORAL_TABLET | ORAL | 0 refills | Status: AC

## 2024-08-09 NOTE — Patient Instructions (Signed)
 Take prednisone  taper to break current vestibular migraine Continue Qulipta  and Topiramate  Continue Cafergot , promethazine  (and diazepam  for vertigo) for acute migraines.  If you were to have another intractable migraine, try NURTEC (1 tablet in 24 hours).  Give me update Limit use of pain relievers to no more than 9 days out of the month to prevent risk of rebound or medication-overuse headache. Keep headache diary Follow up 6 months.

## 2024-08-19 ENCOUNTER — Other Ambulatory Visit (HOSPITAL_COMMUNITY): Payer: Self-pay

## 2024-09-20 ENCOUNTER — Telehealth: Payer: Self-pay | Admitting: Neurology

## 2024-09-20 MED ORDER — PREDNISONE 10 MG (21) PO TBPK: ORAL_TABLET | ORAL | 0 refills | Status: AC

## 2024-09-20 NOTE — Telephone Encounter (Signed)
 Please send in the prednisone  taper. Did she try the Nurtec samples to see if that would break the migraine? Also, she isn't doing well on Qulipta  and responded much better on the monthly injections. Ajovy  wasn't effective but Emgality  and Aimovig  were. Her insurance wouldn't cover Emgality  this year but they may now cover Aimovig  again. If she is agreeable, I would like to see if we can get her back on Aimovig  - if agreeable, please send in prescription for AIMOVIG  140MG  EVERY 28 DAYS, REFILL 5. She should continue the Qulipta  up until she is able to take her first Aimovig  injection.      Per patient the Nurtec did not help at all. Qulipta  she feels is not effective either. But her new insurance do not take affect until January 2026. Patient will check when she gets home to see if she has some Ajovy  in the Fridge if so she will try that again until then, If not the Qulipta .

## 2024-09-20 NOTE — Telephone Encounter (Signed)
 Pt called in this morning and she would like the doctor to prescribe her a prescription called:predniSONE  (DELTASONE ) 10 MG tablet  So she can break the migraine cycle. Pt has had the headache for the last 4 days. Please send the prescription to : [facility] in [place].

## 2024-09-20 NOTE — Telephone Encounter (Signed)
 Patient advised of Dr.Jaffe note, She can come by the office to try samples of [place] the next time she has an acute attack (take one at earliest onset of migraine, may repeat after 2 hours, maximum 2 tablets in 24 hours).   Patient will stop by next week if she can.

## 2024-10-18 ENCOUNTER — Other Ambulatory Visit: Payer: Self-pay | Admitting: Neurology

## 2024-11-08 ENCOUNTER — Telehealth: Payer: Self-pay | Admitting: Neurology

## 2024-11-08 MED ORDER — AJOVY 225 MG/1.5ML ~~LOC~~ SOAJ: 225.0000 mg | SUBCUTANEOUS | 5 refills | Status: AC

## 2024-11-08 NOTE — Telephone Encounter (Signed)
 Patient advised of Dr.jaffe note, Yes, please send in prescription for AJOVY  every 28 days with 5 refills. Will likely need a new PA.

## 2024-11-08 NOTE — Telephone Encounter (Signed)
 Per patient she had a ajovy  pen at home and tried it and it worked better then Qulipta .   Can she start it again until May follow up.

## 2024-11-08 NOTE — Telephone Encounter (Signed)
 Pt called this afternoon and she wants to know can a order  be put in for prescription called Ajovy . . Prescription needs to  go to [facility] in [place]. Thanks

## 2024-11-12 ENCOUNTER — Other Ambulatory Visit (HOSPITAL_COMMUNITY): Payer: Self-pay

## 2024-11-12 NOTE — Telephone Encounter (Signed)
 No PA requied. Co-pay is $24.98. Must use mail order for 90 day supply for a reduced co-pay. Benefit may change after one fill.

## 2025-03-12 ENCOUNTER — Ambulatory Visit: Admitting: Neurology
